# Patient Record
Sex: Female | Born: 1950 | Race: White | Hispanic: No | Smoking: Never smoker
Health system: Southern US, Community
[De-identification: ages and names within clinical notes are randomized; demographics above are authoritative.]

## PROBLEM LIST (undated history)

## (undated) DIAGNOSIS — F419 Anxiety disorder, unspecified: Secondary | ICD-10-CM

## (undated) DIAGNOSIS — C50912 Malignant neoplasm of unspecified site of left female breast: Secondary | ICD-10-CM

## (undated) DIAGNOSIS — C50919 Malignant neoplasm of unspecified site of unspecified female breast: Secondary | ICD-10-CM

## (undated) DIAGNOSIS — I10 Essential (primary) hypertension: Secondary | ICD-10-CM

## (undated) DIAGNOSIS — N289 Disorder of kidney and ureter, unspecified: Secondary | ICD-10-CM

## (undated) HISTORY — DX: Disorder of kidney and ureter, unspecified: N28.9

## (undated) HISTORY — DX: Anxiety disorder, unspecified: F41.9

## (undated) HISTORY — PX: BIOPSY BREAST: PRO8

## (undated) HISTORY — DX: Malignant neoplasm of unspecified site of unspecified female breast: C50.919

## (undated) HISTORY — PX: BREAST LUMPECTOMY: SHX2

## (undated) HISTORY — DX: Essential (primary) hypertension: I10

## (undated) HISTORY — DX: Malignant neoplasm of unspecified site of left female breast: C50.912

## (undated) HISTORY — PX: DILATION AND CURETTAGE OF UTERUS: SHX78

---

## 2006-06-03 ENCOUNTER — Ambulatory Visit: Payer: Self-pay | Admitting: Nurse Practitioner

## 2008-12-19 ENCOUNTER — Ambulatory Visit: Payer: Self-pay | Admitting: Family Medicine

## 2009-01-08 ENCOUNTER — Ambulatory Visit: Payer: Self-pay | Admitting: Family Medicine

## 2010-01-10 ENCOUNTER — Inpatient Hospital Stay: Payer: Self-pay | Admitting: *Deleted

## 2010-02-28 ENCOUNTER — Ambulatory Visit: Payer: Self-pay | Admitting: Internal Medicine

## 2011-03-06 ENCOUNTER — Ambulatory Visit: Payer: Self-pay | Admitting: Internal Medicine

## 2011-09-02 HISTORY — PX: BREAST BIOPSY: SHX20

## 2012-01-26 DIAGNOSIS — C50912 Malignant neoplasm of unspecified site of left female breast: Secondary | ICD-10-CM

## 2012-01-26 HISTORY — DX: Malignant neoplasm of unspecified site of left female breast: C50.912

## 2012-03-09 ENCOUNTER — Ambulatory Visit: Payer: Self-pay | Admitting: Internal Medicine

## 2012-03-11 ENCOUNTER — Ambulatory Visit: Payer: Self-pay | Admitting: Internal Medicine

## 2012-03-30 ENCOUNTER — Ambulatory Visit: Payer: Self-pay | Admitting: Surgery

## 2012-04-05 ENCOUNTER — Ambulatory Visit: Payer: Self-pay | Admitting: Surgery

## 2012-04-05 LAB — CBC WITH DIFFERENTIAL/PLATELET
Basophil #: 0 10*3/uL (ref 0.0–0.1)
Eosinophil #: 0.1 10*3/uL (ref 0.0–0.7)
HCT: 41.6 % (ref 35.0–47.0)
Lymphocyte %: 33.5 %
MCHC: 33.9 g/dL (ref 32.0–36.0)
Monocyte %: 7.4 %
Neutrophil #: 3.1 10*3/uL (ref 1.4–6.5)
Neutrophil %: 56.9 %
Platelet: 227 10*3/uL (ref 150–440)
RDW: 12.3 % (ref 11.5–14.5)
WBC: 5.5 10*3/uL (ref 3.6–11.0)

## 2012-04-05 LAB — BASIC METABOLIC PANEL
Anion Gap: 7 (ref 7–16)
BUN: 13 mg/dL (ref 7–18)
Chloride: 101 mmol/L (ref 98–107)
EGFR (African American): >60
Glucose: 130 mg/dL — ABNORMAL HIGH (ref 65–99)
Potassium: 3.6 mmol/L (ref 3.5–5.1)
Sodium: 141 mmol/L (ref 136–145)

## 2012-04-07 ENCOUNTER — Ambulatory Visit: Payer: Self-pay | Admitting: Radiation Oncology

## 2012-04-08 ENCOUNTER — Ambulatory Visit: Payer: Self-pay | Admitting: Surgery

## 2012-04-21 LAB — PATHOLOGY REPORT

## 2012-04-29 ENCOUNTER — Emergency Department: Payer: Self-pay | Admitting: Unknown Physician Specialty

## 2012-04-29 LAB — COMPREHENSIVE METABOLIC PANEL
Albumin: 4.1 g/dL (ref 3.4–5.0)
BUN: 8 mg/dL (ref 7–18)
Chloride: 102 mmol/L (ref 98–107)
Glucose: 130 mg/dL — ABNORMAL HIGH (ref 65–99)
Potassium: 3.9 mmol/L (ref 3.5–5.1)
SGOT(AST): 19 U/L (ref 15–37)
Sodium: 139 mmol/L (ref 136–145)

## 2012-04-29 LAB — URINALYSIS, COMPLETE
Bacteria: NONE SEEN
Glucose,UR: NEGATIVE mg/dL (ref 0–75)
Nitrite: NEGATIVE
Ph: 7 (ref 4.5–8.0)
Protein: NEGATIVE
RBC,UR: 1 /HPF (ref 0–5)
Specific Gravity: 1.011 (ref 1.003–1.030)
WBC UR: <1 /HPF (ref 0–5)

## 2012-04-29 LAB — CBC
HCT: 41.3 % (ref 35.0–47.0)
HGB: 14.2 g/dL (ref 12.0–16.0)
MCHC: 34.3 g/dL (ref 32.0–36.0)
MCV: 89 fL (ref 80–100)
Platelet: 221 10*3/uL (ref 150–440)
RDW: 12.4 % (ref 11.5–14.5)
WBC: 11.2 10*3/uL — ABNORMAL HIGH (ref 3.6–11.0)

## 2012-04-29 LAB — TROPONIN I: Troponin-I: <0.02 ng/mL

## 2012-05-02 ENCOUNTER — Ambulatory Visit: Payer: Self-pay | Admitting: Radiation Oncology

## 2012-05-05 LAB — CULTURE, BLOOD (SINGLE)

## 2012-05-09 ENCOUNTER — Emergency Department: Payer: Self-pay | Admitting: Surgery

## 2012-05-12 ENCOUNTER — Ambulatory Visit: Payer: Self-pay | Admitting: Surgery

## 2012-06-01 ENCOUNTER — Ambulatory Visit: Payer: Self-pay | Admitting: Radiation Oncology

## 2012-07-02 ENCOUNTER — Ambulatory Visit: Payer: Self-pay | Admitting: Radiation Oncology

## 2012-09-01 ENCOUNTER — Ambulatory Visit: Payer: Self-pay | Admitting: Oncology

## 2012-09-14 LAB — COMPREHENSIVE METABOLIC PANEL
Albumin: 4.2 g/dL (ref 3.4–5.0)
Alkaline Phosphatase: 104 U/L (ref 50–136)
Anion Gap: 8 (ref 7–16)
BUN: 12 mg/dL (ref 7–18)
Bilirubin,Total: 0.5 mg/dL (ref 0.2–1.0)
Chloride: 100 mmol/L (ref 98–107)
EGFR (African American): >60
Glucose: 103 mg/dL — ABNORMAL HIGH (ref 65–99)
Potassium: 4 mmol/L (ref 3.5–5.1)
SGOT(AST): 18 U/L (ref 15–37)
SGPT (ALT): 34 U/L (ref 12–78)
Sodium: 140 mmol/L (ref 136–145)
Total Protein: 7.9 g/dL (ref 6.4–8.2)

## 2012-09-14 LAB — CBC CANCER CENTER
Basophil %: 1 %
Eosinophil #: 0.3 x10 3/mm (ref 0.0–0.7)
HCT: 44.8 % (ref 35.0–47.0)
MCH: 29.7 pg (ref 26.0–34.0)
MCV: 87 fL (ref 80–100)
Monocyte #: 0.7 x10 3/mm (ref 0.2–0.9)
Neutrophil %: 46 %
WBC: 6.4 x10 3/mm (ref 3.6–11.0)

## 2012-10-02 ENCOUNTER — Ambulatory Visit: Payer: Self-pay | Admitting: Oncology

## 2012-11-10 ENCOUNTER — Ambulatory Visit: Payer: Self-pay | Admitting: Oncology

## 2013-02-14 ENCOUNTER — Ambulatory Visit: Payer: Self-pay | Admitting: Oncology

## 2013-03-01 ENCOUNTER — Ambulatory Visit: Payer: Self-pay | Admitting: Oncology

## 2013-04-01 ENCOUNTER — Ambulatory Visit: Payer: Self-pay | Admitting: Oncology

## 2013-06-22 ENCOUNTER — Ambulatory Visit: Payer: Self-pay | Admitting: Oncology

## 2013-06-22 LAB — CBC CANCER CENTER
Basophil #: 0 x10 3/mm (ref 0.0–0.1)
Eosinophil #: 0.2 x10 3/mm (ref 0.0–0.7)
Eosinophil %: 3 %
HCT: 43.7 % (ref 35.0–47.0)
Lymphocyte #: 1.8 x10 3/mm (ref 1.0–3.6)
Lymphocyte %: 31.3 %
MCH: 30.1 pg (ref 26.0–34.0)
MCHC: 34 g/dL (ref 32.0–36.0)
MCV: 89 fL (ref 80–100)
Monocyte #: 0.5 x10 3/mm (ref 0.2–0.9)
Neutrophil #: 3.2 x10 3/mm (ref 1.4–6.5)
Neutrophil %: 56.2 %
Platelet: 272 x10 3/mm (ref 150–440)
RDW: 12.2 % (ref 11.5–14.5)
WBC: 5.6 x10 3/mm (ref 3.6–11.0)

## 2013-06-22 LAB — COMPREHENSIVE METABOLIC PANEL
Albumin: 4.2 g/dL (ref 3.4–5.0)
BUN: 16 mg/dL (ref 7–18)
Bilirubin,Total: 0.6 mg/dL (ref 0.2–1.0)
Calcium, Total: 9 mg/dL (ref 8.5–10.1)
Chloride: 99 mmol/L (ref 98–107)
Co2: 34 mmol/L — ABNORMAL HIGH (ref 21–32)
Glucose: 120 mg/dL — ABNORMAL HIGH (ref 65–99)
Osmolality: 280 (ref 275–301)
SGOT(AST): 12 U/L — ABNORMAL LOW (ref 15–37)
SGPT (ALT): 21 U/L (ref 12–78)
Sodium: 139 mmol/L (ref 136–145)
Total Protein: 8 g/dL (ref 6.4–8.2)

## 2013-06-23 LAB — CANCER ANTIGEN 27.29: CA 27.29: 29.7 U/mL (ref 0.0–38.6)

## 2013-07-02 ENCOUNTER — Ambulatory Visit: Payer: Self-pay | Admitting: Oncology

## 2013-08-01 ENCOUNTER — Ambulatory Visit: Payer: Self-pay | Admitting: Oncology

## 2013-12-21 ENCOUNTER — Ambulatory Visit: Payer: Self-pay | Admitting: Oncology

## 2013-12-21 LAB — COMPREHENSIVE METABOLIC PANEL
ALK PHOS: 91 U/L
AST: 12 U/L — AB (ref 15–37)
Albumin: 4 g/dL (ref 3.4–5.0)
Anion Gap: 6 — ABNORMAL LOW (ref 7–16)
BILIRUBIN TOTAL: 0.4 mg/dL (ref 0.2–1.0)
BUN: 19 mg/dL — ABNORMAL HIGH (ref 7–18)
CALCIUM: 9.7 mg/dL (ref 8.5–10.1)
Chloride: 100 mmol/L (ref 98–107)
Co2: 34 mmol/L — ABNORMAL HIGH (ref 21–32)
Creatinine: 1.21 mg/dL (ref 0.60–1.30)
EGFR (African American): 55 — ABNORMAL LOW
EGFR (Non-African Amer.): 48 — ABNORMAL LOW
GLUCOSE: 102 mg/dL — AB (ref 65–99)
Osmolality: 282 (ref 275–301)
Potassium: 3.7 mmol/L (ref 3.5–5.1)
SGPT (ALT): 18 U/L (ref 12–78)
SODIUM: 140 mmol/L (ref 136–145)
Total Protein: 7.5 g/dL (ref 6.4–8.2)

## 2013-12-21 LAB — CBC CANCER CENTER
BASOS PCT: 1 %
Basophil #: 0.1 x10 3/mm (ref 0.0–0.1)
Eosinophil #: 0.2 x10 3/mm (ref 0.0–0.7)
Eosinophil %: 3.8 %
HCT: 42.6 % (ref 35.0–47.0)
HGB: 14.1 g/dL (ref 12.0–16.0)
Lymphocyte #: 1.8 x10 3/mm (ref 1.0–3.6)
Lymphocyte %: 30.7 %
MCH: 29.2 pg (ref 26.0–34.0)
MCHC: 33.1 g/dL (ref 32.0–36.0)
MCV: 88 fL (ref 80–100)
Monocyte #: 0.6 x10 3/mm (ref 0.2–0.9)
Monocyte %: 10.2 %
Neutrophil #: 3.2 x10 3/mm (ref 1.4–6.5)
Neutrophil %: 54.3 %
PLATELETS: 243 x10 3/mm (ref 150–440)
RBC: 4.83 10*6/uL (ref 3.80–5.20)
RDW: 12.5 % (ref 11.5–14.5)
WBC: 5.9 x10 3/mm (ref 3.6–11.0)

## 2013-12-30 ENCOUNTER — Ambulatory Visit: Payer: Self-pay | Admitting: Oncology

## 2014-01-05 ENCOUNTER — Emergency Department: Payer: Self-pay | Admitting: Emergency Medicine

## 2014-01-06 LAB — BASIC METABOLIC PANEL
Anion Gap: 5 — ABNORMAL LOW (ref 7–16)
BUN: 17 mg/dL (ref 7–18)
Calcium, Total: 9.5 mg/dL (ref 8.5–10.1)
Chloride: 100 mmol/L (ref 98–107)
Co2: 29 mmol/L (ref 21–32)
Creatinine: 1.32 mg/dL — ABNORMAL HIGH (ref 0.60–1.30)
EGFR (African American): 50 — ABNORMAL LOW
EGFR (Non-African Amer.): 43 — ABNORMAL LOW
Glucose: 122 mg/dL — ABNORMAL HIGH (ref 65–99)
Osmolality: 271 (ref 275–301)
POTASSIUM: 4.1 mmol/L (ref 3.5–5.1)
Sodium: 134 mmol/L — ABNORMAL LOW (ref 136–145)

## 2014-01-06 LAB — CBC WITH DIFFERENTIAL/PLATELET
BASOS ABS: 0.1 10*3/uL (ref 0.0–0.1)
BASOS PCT: 0.6 %
EOS ABS: 0.1 10*3/uL (ref 0.0–0.7)
EOS PCT: 0.5 %
HCT: 41.4 % (ref 35.0–47.0)
HGB: 13.8 g/dL (ref 12.0–16.0)
LYMPHS ABS: 1.2 10*3/uL (ref 1.0–3.6)
Lymphocyte %: 9.6 %
MCH: 29.7 pg (ref 26.0–34.0)
MCHC: 33.4 g/dL (ref 32.0–36.0)
MCV: 89 fL (ref 80–100)
MONO ABS: 0.8 x10 3/mm (ref 0.2–0.9)
Monocyte %: 6.1 %
NEUTROS ABS: 10.5 10*3/uL — AB (ref 1.4–6.5)
Neutrophil %: 83.2 %
Platelet: 250 10*3/uL (ref 150–440)
RBC: 4.65 10*6/uL (ref 3.80–5.20)
RDW: 12.1 % (ref 11.5–14.5)
WBC: 12.7 10*3/uL — ABNORMAL HIGH (ref 3.6–11.0)

## 2014-01-06 LAB — TROPONIN I: Troponin-I: <0.02 ng/mL

## 2014-01-07 ENCOUNTER — Observation Stay: Payer: Self-pay | Admitting: Internal Medicine

## 2014-01-07 LAB — URINALYSIS, COMPLETE
Bacteria: NONE SEEN
Bilirubin,UR: NEGATIVE
Glucose,UR: NEGATIVE mg/dL (ref 0–75)
Leukocyte Esterase: NEGATIVE
Nitrite: NEGATIVE
PH: 5 (ref 4.5–8.0)
Protein: NEGATIVE
RBC,UR: <1 /HPF (ref 0–5)
Specific Gravity: 1.008 (ref 1.003–1.030)

## 2014-01-07 LAB — APTT: Activated PTT: 27.7 secs (ref 23.6–35.9)

## 2014-01-07 LAB — PROTIME-INR
INR: 1
PROTHROMBIN TIME: 13.5 s (ref 11.5–14.7)

## 2014-01-07 LAB — TROPONIN I: Troponin-I: <0.02 ng/mL

## 2014-01-07 LAB — CK TOTAL AND CKMB (NOT AT ARMC)
CK, Total: 74 U/L
CK-MB: <0.5 ng/mL — ABNORMAL LOW (ref 0.5–3.6)

## 2014-01-26 DIAGNOSIS — G4700 Insomnia, unspecified: Secondary | ICD-10-CM | POA: Insufficient documentation

## 2014-01-27 DIAGNOSIS — R7303 Prediabetes: Secondary | ICD-10-CM | POA: Insufficient documentation

## 2014-04-03 IMAGING — MG MM DIGITAL SCREENING BILAT W/ CAD
1 series · 8 of 8 positions shown · non-contrast
Comparison: none

REASON FOR EXAM: SCR MAMMO NO ORDER
COMMENTS:

PROCEDURE:     MMM - MMM DGT SCR NO ORDER W/CAD  - March 09, 2012 [DATE]
RESULT:
Comparisons: 03/06/2011, 02/28/2010, 12/19/2008, and 12/19/2008.

[Series 7740: R CC · right · 8 of 9 slices shown]
[im 1/9]
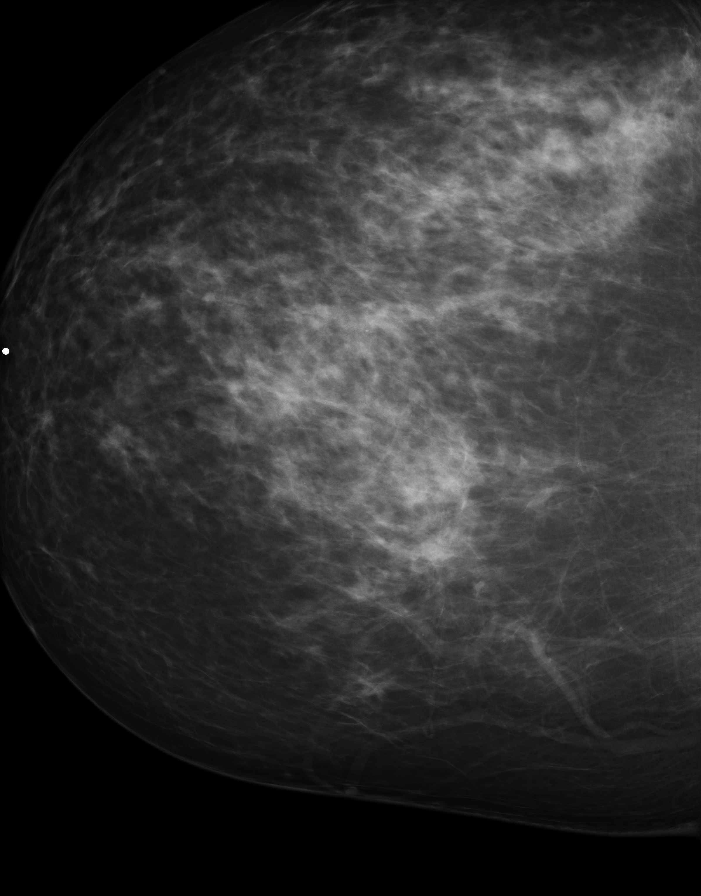
[im 2/9]
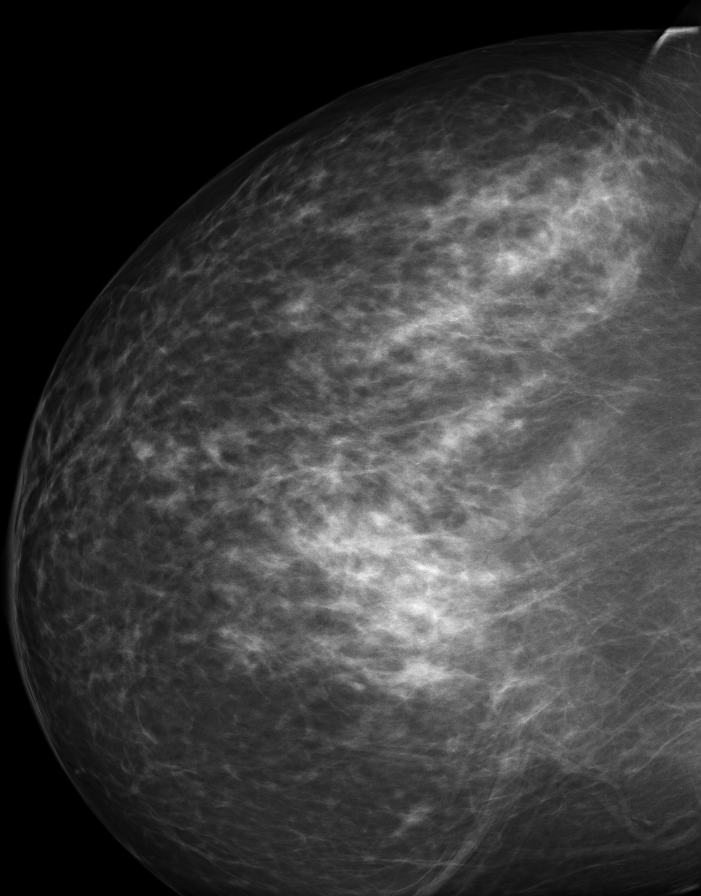
[im 3/9]
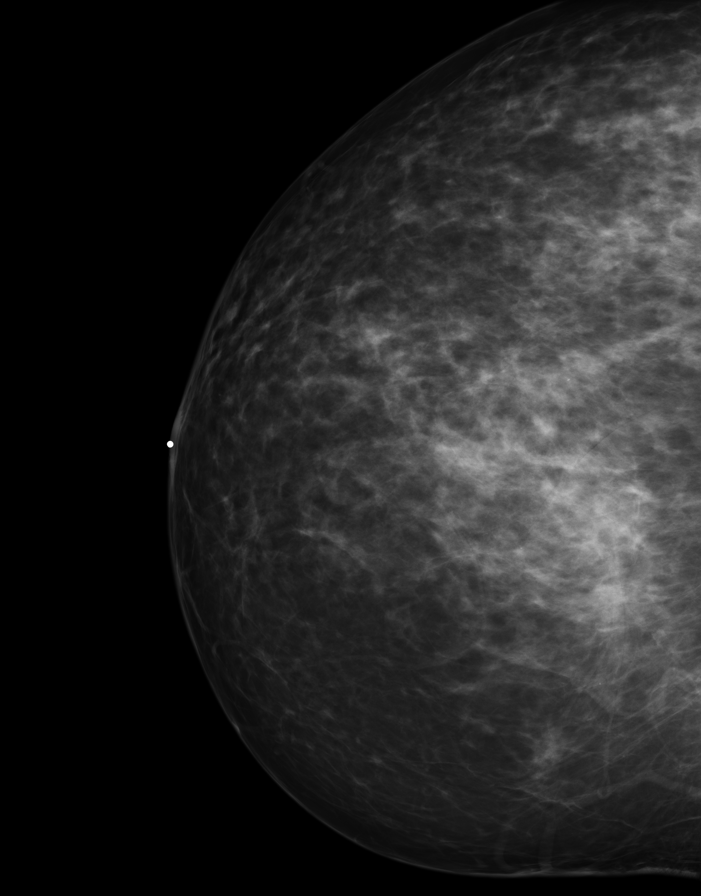
[im 4/9]
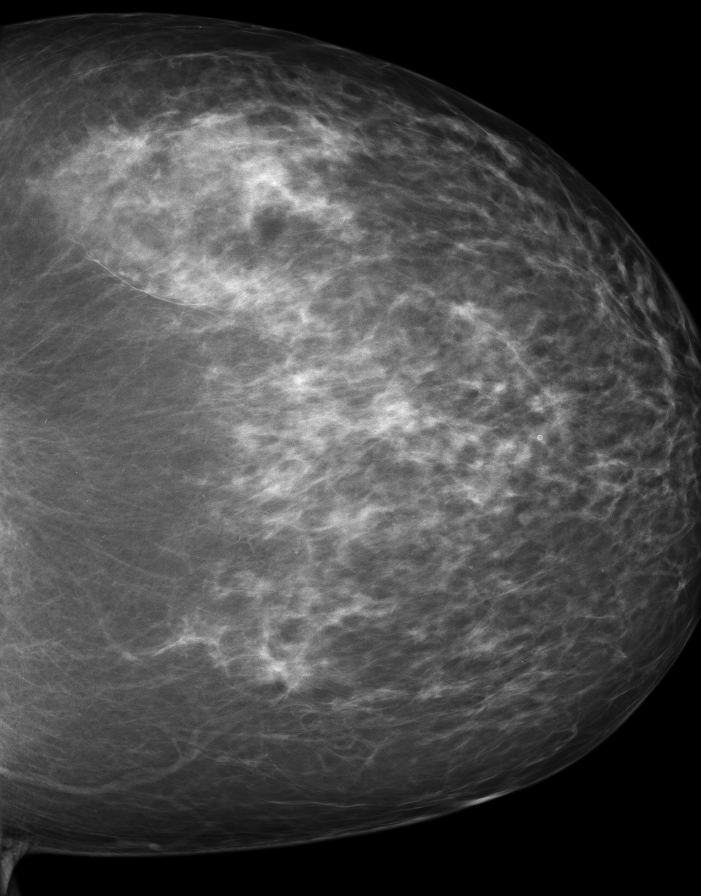
[im 5/9]
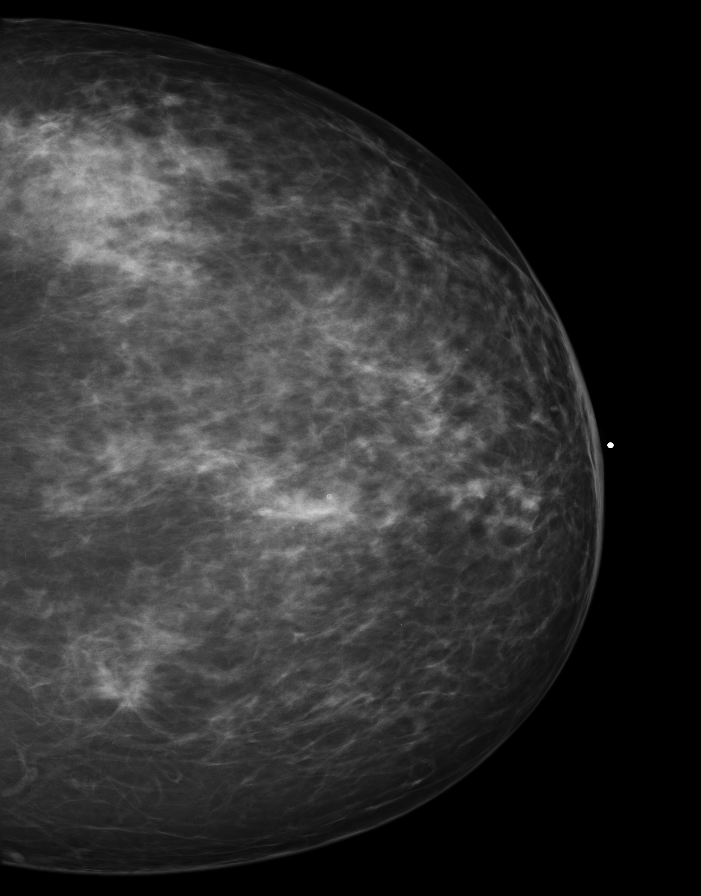
[im 6/9]
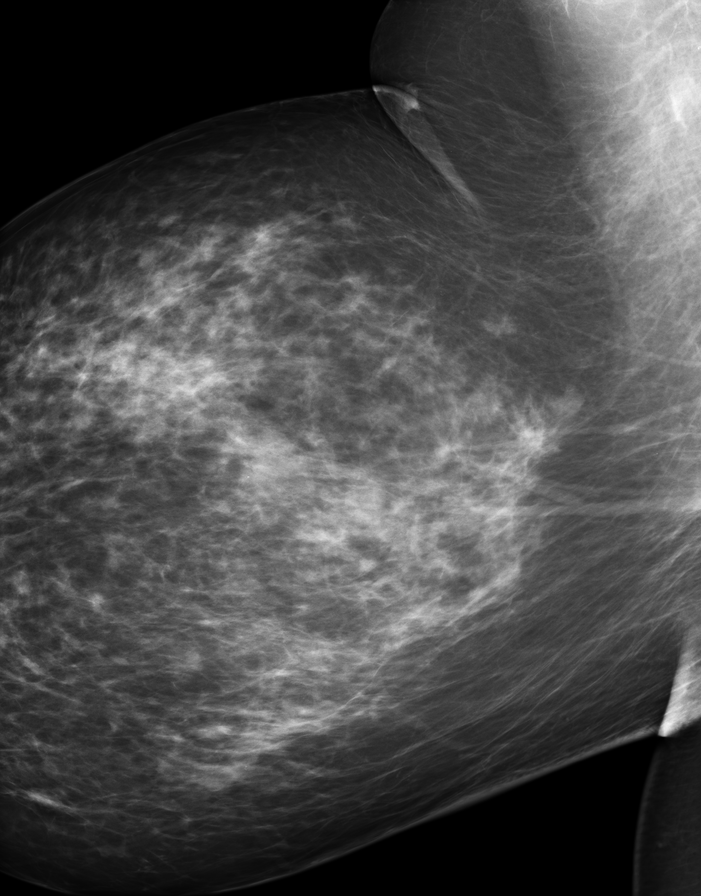
[im 7/9]
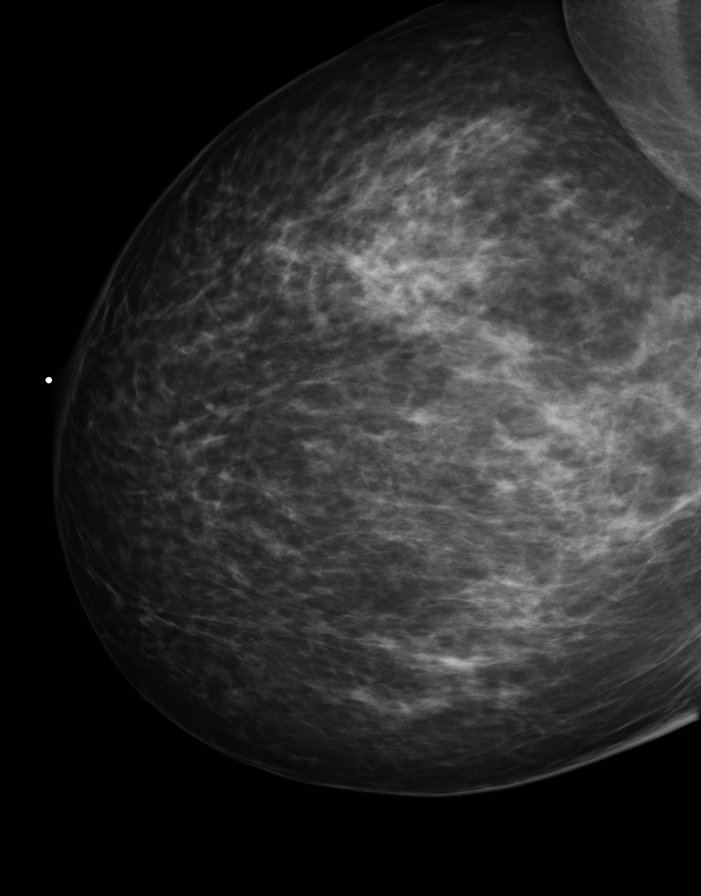
[im 9/9]
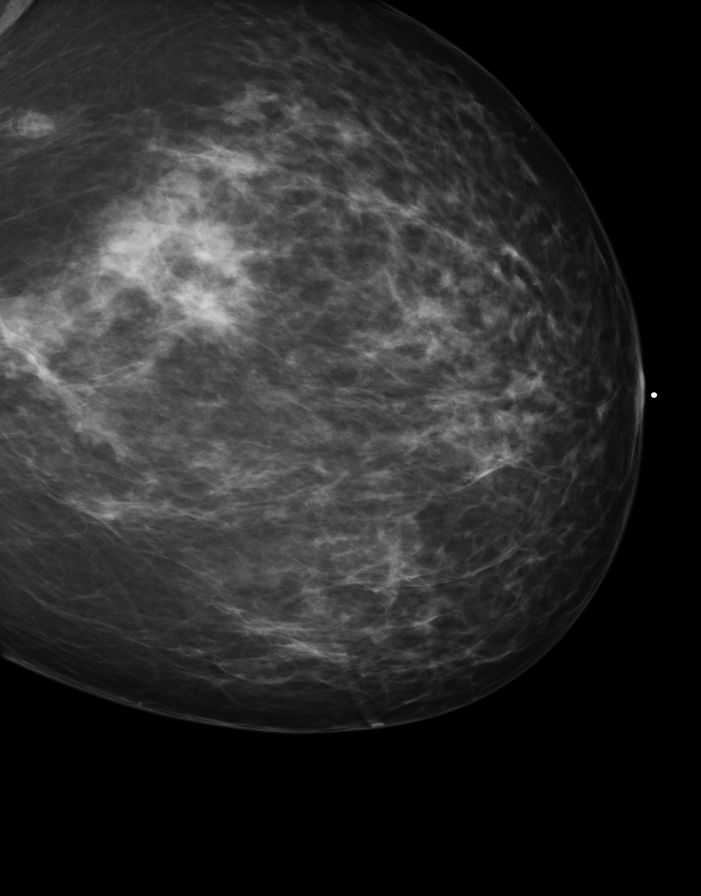

[8 of 8 positions shown; findings below may reference images not displayed]

FINDINGS: The breast tissue is heterogeneously dense. There is an asymmetry in the
left MLO view posterior depth at the level of the nipple line. An asymmetry
is seen in both of the left MLO views in a similar position. It is unclear
if these represent the same asymmetry. Additionally, there is a small
asymmetry in the medial aspect of the left CC view, posterior depth. No
suspicious masses or calcifications are identified in the right breast.
IMPRESSION: BI-RADS: Category 0 - Needs Addtional Imaging Evaluation

Recommend true lateral and spot compression magnification views of the
asymmetries seen in each of the left MLO views as well as the medial left CC
view.

A NEGATIVE MAMMOGRAM REPORT DOES NOT PRECLUDE BIOPSY OR OTHER EVALUATION OF
A CLINICALLY PALPABLE OR OTHERWISE SUSPICIOUS MASS OR LESION. BREAST CANCER
MAY NOT BE DETECTED BY MAMMOGRAPHY IN UP TO 10% OF CASES.

## 2014-04-30 IMAGING — CR DG CHEST 2V
1 series · 2 of 2 positions shown · non-contrast
Comparison: none

REASON FOR EXAM: breast ca
COMMENTS:

PROCEDURE:     DXR - DXR CHEST PA (OR AP) AND LATERAL  - April 05, 2012 [DATE]
RESULT:     The lungs are clear. The heart and pulmonary vessels are normal.
The bony and mediastinal structures are unremarkable. There is no effusion.
There is no pneumothorax or evidence of congestive failure.

[Series 1: w chest pa · 0.14mm/px · 2 of 2 slices shown]
[im 1/2]
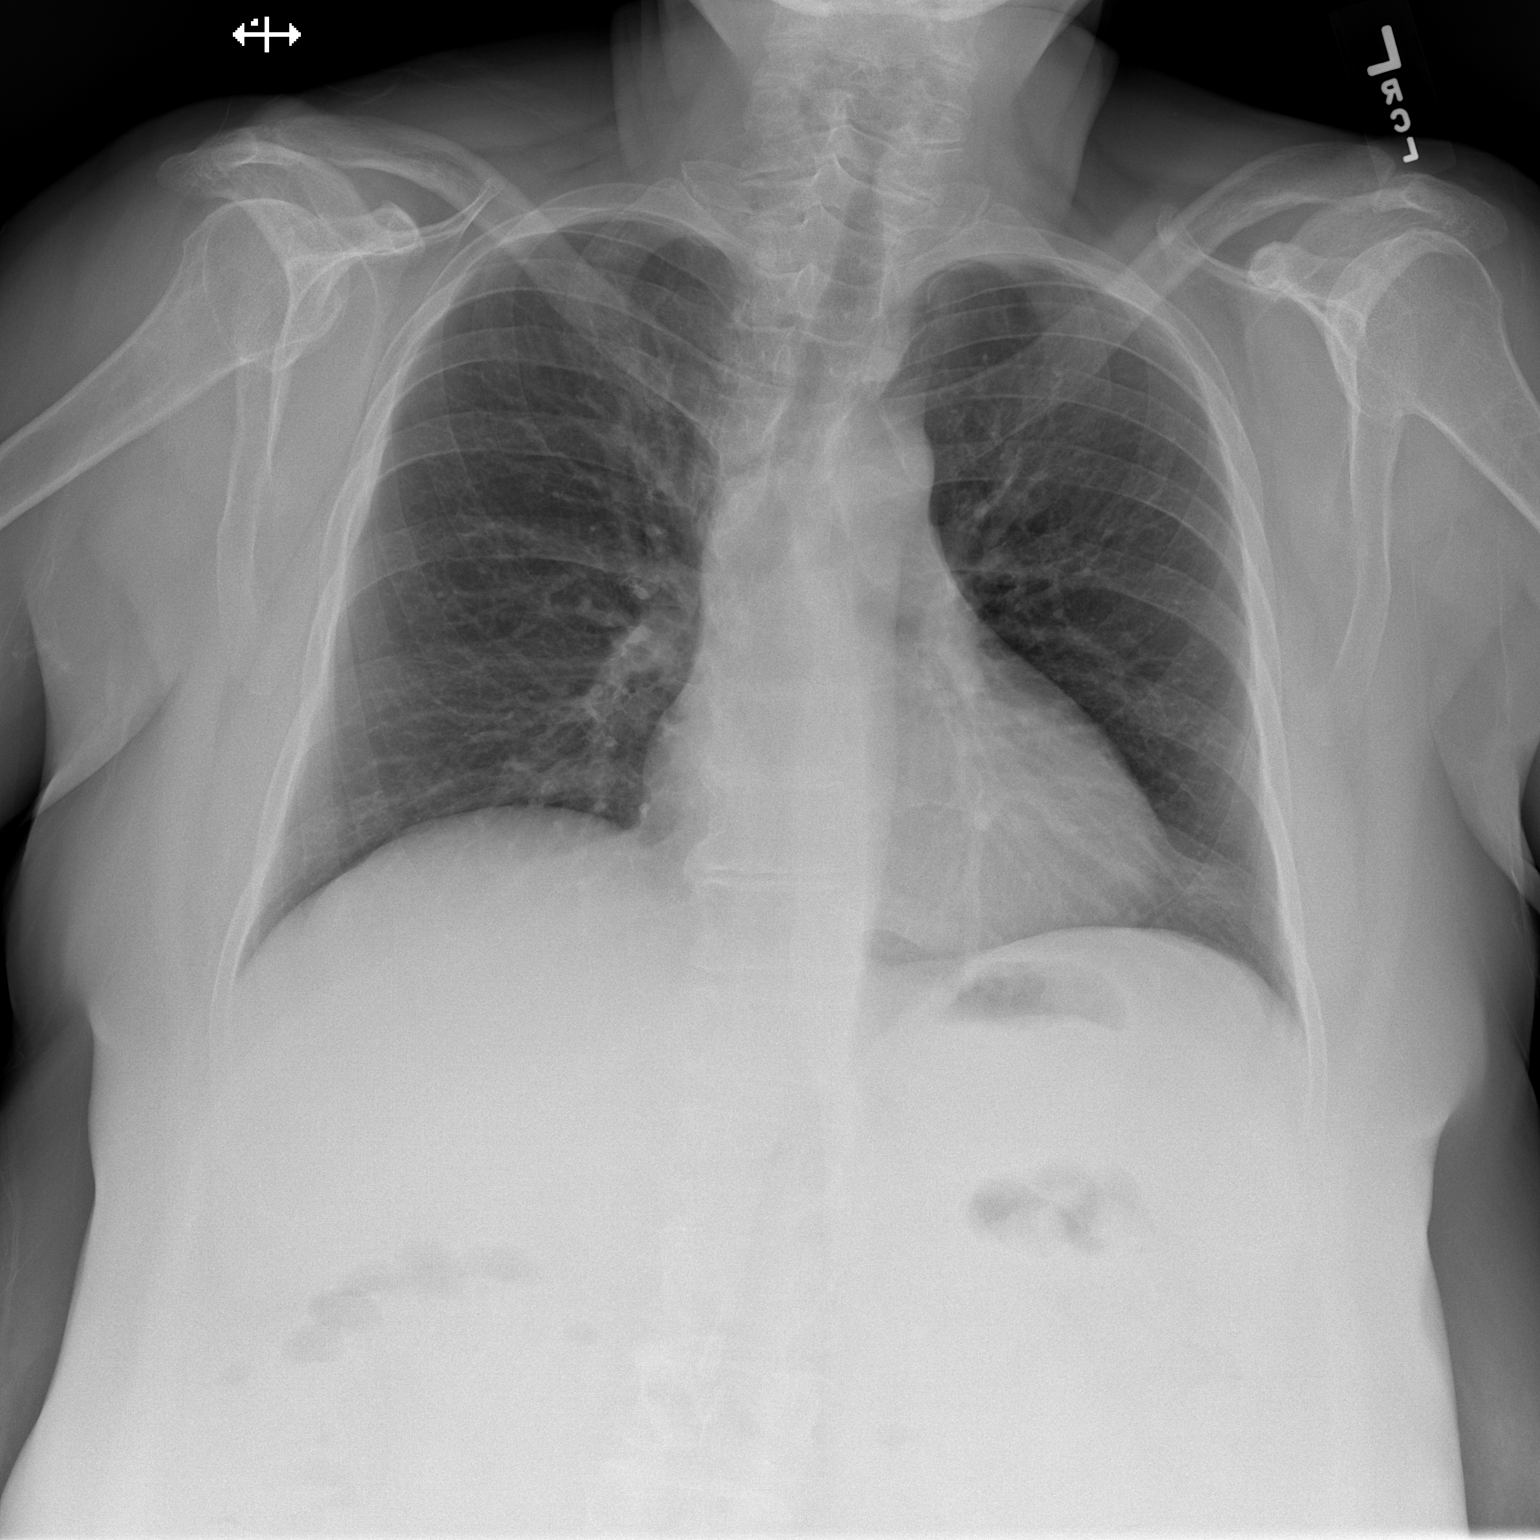
[im 2/2]
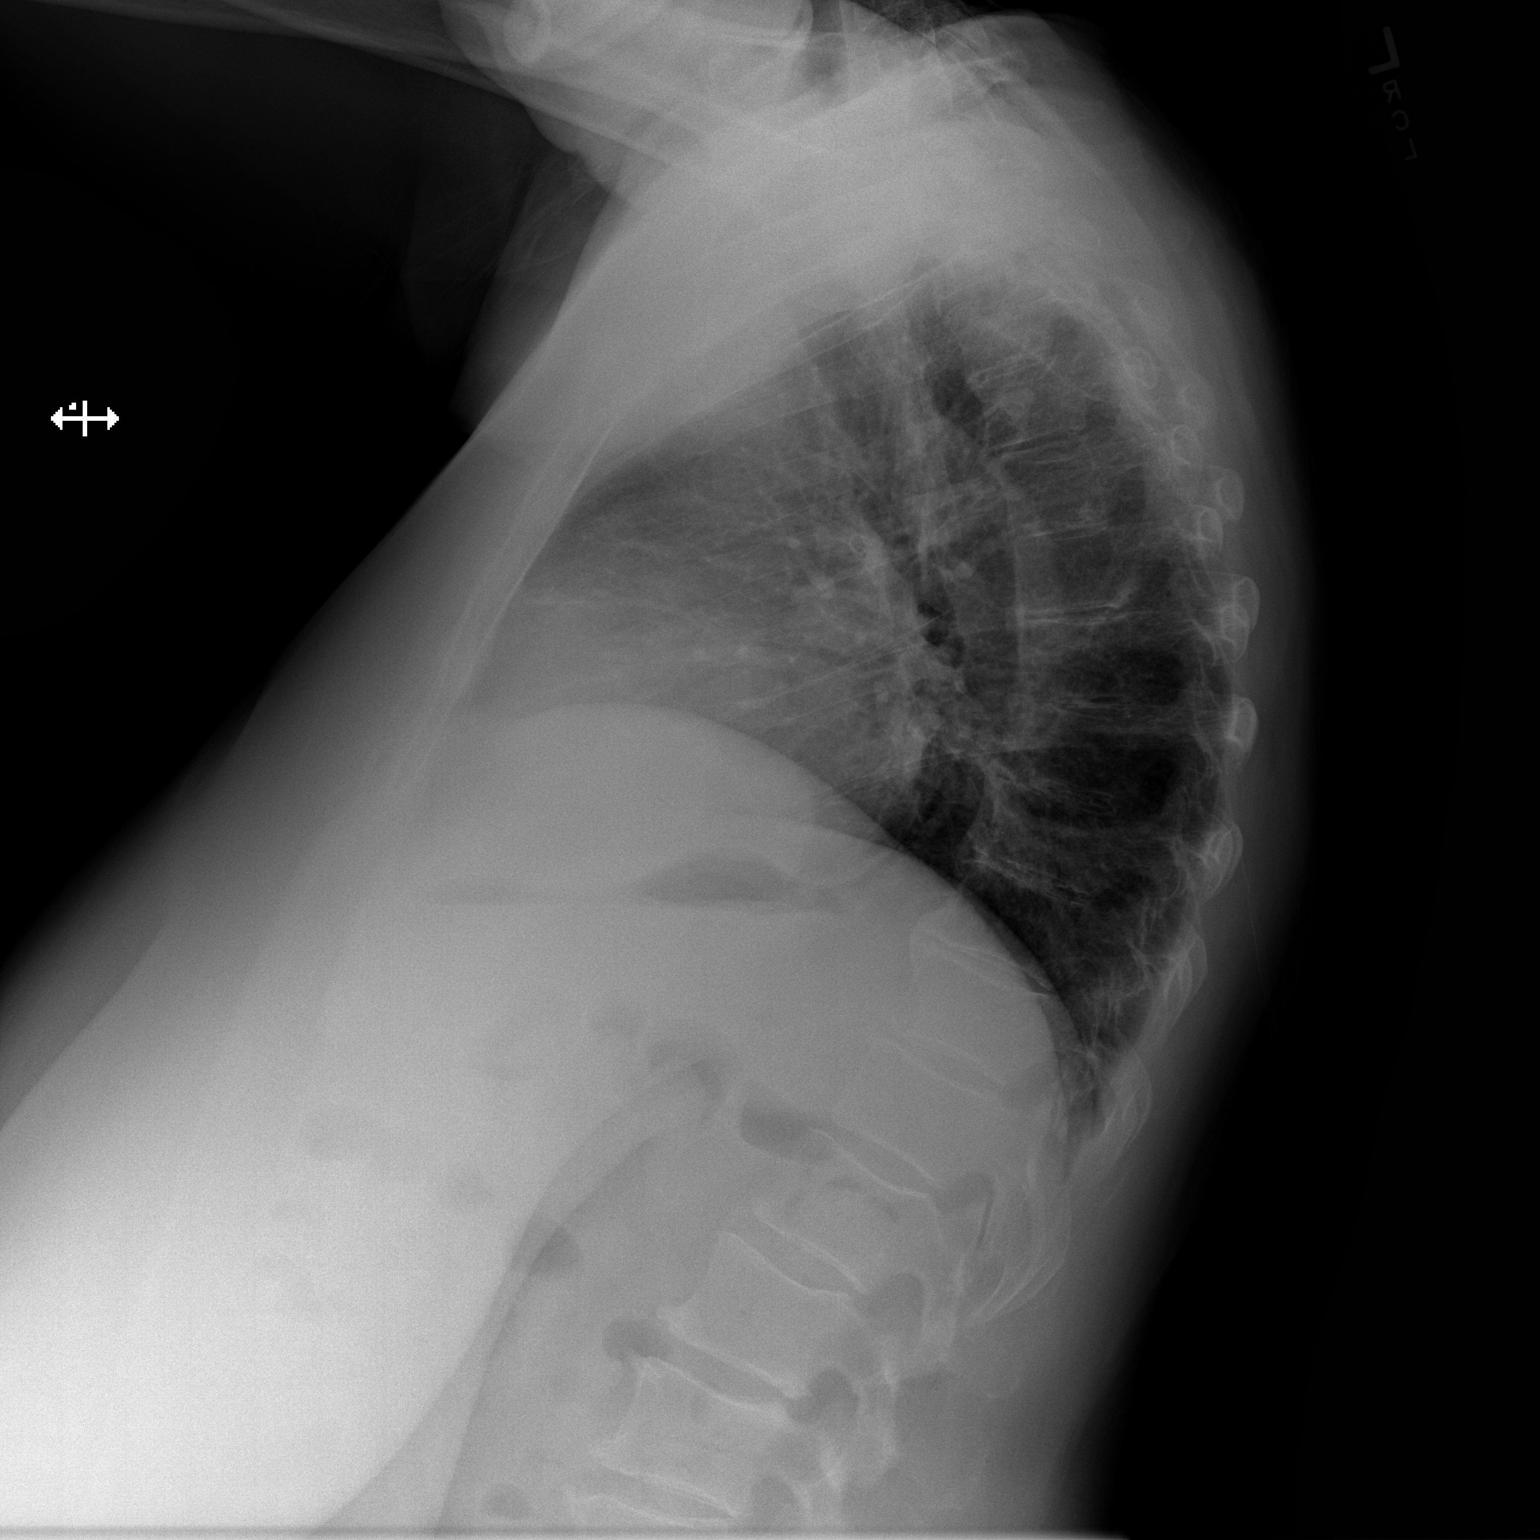

[2 of 2 positions shown; findings below may reference images not displayed]

IMPRESSION: No acute cardiopulmonary disease.

[REDACTED]

## 2014-07-08 ENCOUNTER — Ambulatory Visit: Payer: Self-pay | Admitting: Internal Medicine

## 2014-07-13 ENCOUNTER — Ambulatory Visit: Payer: Self-pay | Admitting: Oncology

## 2014-07-19 ENCOUNTER — Ambulatory Visit: Payer: Self-pay | Admitting: Oncology

## 2014-07-19 LAB — CBC CANCER CENTER
BASOS ABS: 0.1 x10 3/mm (ref 0.0–0.1)
Basophil %: 0.9 %
EOS ABS: 0.4 x10 3/mm (ref 0.0–0.7)
Eosinophil %: 4.6 %
HCT: 44.5 % (ref 35.0–47.0)
HGB: 14.7 g/dL (ref 12.0–16.0)
LYMPHS ABS: 2.8 x10 3/mm (ref 1.0–3.6)
Lymphocyte %: 34.6 %
MCH: 29.1 pg (ref 26.0–34.0)
MCHC: 33 g/dL (ref 32.0–36.0)
MCV: 88 fL (ref 80–100)
Monocyte #: 0.7 x10 3/mm (ref 0.2–0.9)
Monocyte %: 8.3 %
Neutrophil #: 4.2 x10 3/mm (ref 1.4–6.5)
Neutrophil %: 51.6 %
Platelet: 310 x10 3/mm (ref 150–440)
RBC: 5.03 10*6/uL (ref 3.80–5.20)
RDW: 12.3 % (ref 11.5–14.5)
WBC: 8.2 x10 3/mm (ref 3.6–11.0)

## 2014-07-19 LAB — COMPREHENSIVE METABOLIC PANEL
Albumin: 4.5 g/dL (ref 3.4–5.0)
Alkaline Phosphatase: 125 U/L — ABNORMAL HIGH
Anion Gap: 7 (ref 7–16)
BUN: 16 mg/dL (ref 7–18)
Bilirubin,Total: 0.4 mg/dL (ref 0.2–1.0)
Calcium, Total: 9.9 mg/dL (ref 8.5–10.1)
Chloride: 98 mmol/L (ref 98–107)
Co2: 35 mmol/L — ABNORMAL HIGH (ref 21–32)
Creatinine: 1.28 mg/dL (ref 0.60–1.30)
EGFR (Non-African Amer.): 45 — ABNORMAL LOW
GFR CALC AF AMER: 54 — AB
Glucose: 108 mg/dL — ABNORMAL HIGH (ref 65–99)
Osmolality: 281 (ref 275–301)
Potassium: 3.4 mmol/L — ABNORMAL LOW (ref 3.5–5.1)
SGOT(AST): 14 U/L — ABNORMAL LOW (ref 15–37)
SGPT (ALT): 18 U/L
SODIUM: 140 mmol/L (ref 136–145)
Total Protein: 8 g/dL (ref 6.4–8.2)

## 2014-07-20 LAB — CANCER ANTIGEN 27.29: CA 27.29: 17.3 U/mL (ref 0.0–38.6)

## 2014-08-01 ENCOUNTER — Ambulatory Visit: Payer: Self-pay | Admitting: Oncology

## 2014-08-01 DIAGNOSIS — N183 Chronic kidney disease, stage 3 unspecified: Secondary | ICD-10-CM | POA: Insufficient documentation

## 2014-12-15 ENCOUNTER — Other Ambulatory Visit: Payer: Self-pay | Admitting: Oncology

## 2014-12-15 DIAGNOSIS — M8580 Other specified disorders of bone density and structure, unspecified site: Secondary | ICD-10-CM

## 2014-12-15 DIAGNOSIS — Z853 Personal history of malignant neoplasm of breast: Secondary | ICD-10-CM

## 2014-12-19 NOTE — H&P (Signed)
PATIENT NAME:  Hayley Newman, Hayley Newman MR#:  [id number] DATE OF BIRTH:  [birth year]  DATE OF ADMISSION:  04/08/2012  CHIEF COMPLAINT: Breast cancer.   HISTORY OF PRESENT ILLNESS: This is a patient with biopsy proven breast cancer on the left side. This has been proven by core biopsy. She is here for elective left partial mastectomy with needle localization and left sentinel node biopsy on an elective basis.   PAST MEDICAL HISTORY: Newly diagnosed breast cancer.   PAST SURGICAL HISTORY: None.   ALLERGIES: None.   MEDICATIONS:  1. Citalopram. 2. Klor-Con. 3. Advil as needed.  4. Multivitamin.    REVIEW OF SYSTEMS: 10 system review has been performed and is documented in the office chart.   SOCIAL HISTORY: Patient does not smoke or drink.   FAMILY HISTORY: Noncontributory.   PHYSICAL EXAMINATION:  GENERAL: Obese female patient.   VITAL SIGNS: Temperature 97.7, heart rate of 81, other vital signs are stable.   HEENT: No scleral icterus.   NECK: No palpable neck nodes.   CHEST: Clear to auscultation.   CARDIAC: Regular rate and rhythm.   ABDOMEN: Soft, nontender.   EXTREMITIES: Without edema.   NEUROLOGIC: Grossly intact.   INTEGUMENT: No jaundice or rashes.   BREASTS: Examination shows extremely large breast size with a biopsy site in the medial portion of the left breast. Pathology confirms breast cancer of the left breast seen on mammogram and ultrasound.   LABORATORY, DIAGNOSTIC AND RADIOLOGICAL DATA: Creatinine 1.07, potassium 3.6, hemoglobin and hematocrit 14 and 42, platelet count 227.   ASSESSMENT AND PLAN: This is a patient with biopsy-proven breast cancer on the left. She is here for elective needle localized left partial mastectomy with left sentinel node biopsy. She has seen Dr. Baruch Gouty concerning options including external beam versus MammoSite. The plan in this patient with very large breasts and a lesion in the upper inner quadrant would be to not place a CED  but to perform the excisional procedure and place a MammoSite brachii therapy balloon at a later date using ultrasound guidance. This will be discussed with her in detail. I have discussed with her the risks of bleeding, infection, positive margins necessitating additional surgery, positive sentinel node requiring additional lymph node dissection and the risks of temporary or permanent sensorimotor nerve loss and the need for radiotherapy and possibly chemotherapy. She understood and agreed to proceed with this plan.   ____________________________ Jerrol Banana. Burt Knack, MD rec:cms D: 04/07/2012 20:32:41 ET T: 04/08/2012 05:58:37 ET JOB#: [id number]  cc: Jerrol Banana. Burt Knack, MD, <Dictator> Florene Glen MD ELECTRONICALLY SIGNED 04/13/2012 0:44

## 2014-12-19 NOTE — Op Note (Signed)
PATIENT NAME:  Hayley Newman, Hayley Newman MR#:  [id number] DATE OF BIRTH:  1951-06-25  DATE OF PROCEDURE:  05/12/2012  PREOPERATIVE DIAGNOSIS: Draining left breast seroma.   POSTOPERATIVE DIAGNOSIS: Draining left breast seroma.   PROCEDURE: Wound revision and drain placement of left breast seroma.   SURGEON: Lelania Bia E. Burt Knack, MD  ANESTHESIA: General with LMA.   INDICATIONS: This is a patient with breast cancer who has dehisced her wound and is draining serous fluid. Preoperatively we discussed rationale for surgery, the options of observation, risk of bleeding, infection, recurrence, cosmetic deformity and open wound. This was all reviewed for her in the preop holding area. She understood and agreed to proceed.   FINDINGS: Large seroma cavity with serous fluid. No sign of infection. No foul odor, etc.   DESCRIPTION OF PROCEDURE: Patient was induced to general anesthesia, given IV antibiotics and VTE prophylaxis was in place. She was prepped and draped in sterile fashion. The draining seroma from the wound was opened with a blunt instrument and a cavity was entered. Suction was utilized to remove serous fluid. There was no sign of infection. No cloudy fluid and no odor. The blunt instrument was advanced through the cavity to the inferior portion of the left breast at the inframammary fold. An incision was made counter to the blunt instrument and a drain was brought in through this small incision, held in with two sutures of 3-0 nylon. It was placed into the cavity and trimmed properly.   The wound was then closed in layers first with 2-0 figure-of-eight Vicryl followed by 3-0 figure-of-eight Vicryls, then interrupted horizontal mattress sutures of 2-0 nylon were placed and simple sutures of 2-0 nylon were placed. A sterile bacitracin dressing was utilized. Patient tolerated the procedure well. There were no complications. The drain was placed to bulb suction and she was taken to the recovery room in  stable condition to be discharged in care of her family.     Follow-up in 10 days. Sponge, lap and needle counts was correct.   ____________________________ Jerrol Banana. Burt Knack, MD rec:cms D: 05/12/2012 13:53:58 ET T: 05/12/2012 14:00:42 ET JOB#: [id number]  cc: Jerrol Banana. Burt Knack, MD, <Dictator> Florene Glen MD ELECTRONICALLY SIGNED 05/13/2012 12:16

## 2014-12-19 NOTE — Consult Note (Signed)
PATIENT NAME:  Hayley Newman, Hayley Newman MR#:  [id number] DATE OF BIRTH:  November 24, 1950  DATE OF CONSULTATION:  05/09/2012  REFERRING PHYSICIAN:   CONSULTING PHYSICIAN:  Jerrol Banana. Burt Knack, MD  CHIEF COMPLAINT: Drainage from left breast wound.   HISTORY OF PRESENT ILLNESS: This is a patient with a history of breast cancer. She has had necrosis of her skin edges on the left and started draining serous fluid. I spoke to her on the telephone two or three days ago and the drainage was minimal. The drainage has increased. She has actually put an ostomy bag that she had left over from her husband over the wound and is placing approximately 100 mL in the bag at this point.   She denies fevers, chills, and has no breast pain.  For additional history, see documented chart entries previously (she's been seeing me in the office almost weekly for this problem).   PHYSICAL EXAMINATION: No erythema. Serous drainage only. No sign of purulence from the left breast wound. Skin edges have dehisced.    ASSESSMENT AND PLAN: Draining seroma from the left breast following left breast partial mastectomy. I am seeing the patient in the office on Tuesday at which time I would consider and discuss with her the potential for operative debridement, closure, and placement of a diverting drain. She was amenable to this approach and I will see her in the office on Tuesday. I discussed this patient with Dr. Thomasene Lot in the ED.   ____________________________ Jerrol Banana. Burt Knack, MD rec:drc D: 05/09/2012 14:40:11 ET T: 05/09/2012 14:51:15 ET JOB#: [id number]  cc: Jerrol Banana. Burt Knack, MD, <Dictator> Florene Glen MD ELECTRONICALLY SIGNED 05/12/2012 13:07

## 2014-12-19 NOTE — Op Note (Signed)
PATIENT NAME:  Hayley Newman, Hayley Newman MR#:  [id number] DATE OF BIRTH:  27-Jun-1951  DATE OF PROCEDURE:  04/08/2012  PREOPERATIVE DIAGNOSIS: Left breast cancer.   POSTOPERATIVE DIAGNOSIS: Left breast cancer.  PROCEDURES PERFORMED: 1. Left needle localized partial mastectomy.  2. Left sentinel node biopsy.   SURGEON: Phoebe Perch, MD  ASSISTANT: Adelene Idler, PAS  ANESTHESIA: General with LMA.  INDICATIONS: This is a patient with biopsy-proven left breast cancer. She has chosen breast conservation therapy after being counseled concerning her options of mastectomy versus breast conservation therapy. We have also discussed the absolute need for radiotherapy and she has seen radiation oncology in consultation. We have discussed external beam versus accelerated partial breast irradiation and there is at least an attempt for postoperative placement of APBI balloon. The options of observation have been reviewed. The risks of bleeding, infection, recurrence of disease, positive margins necessitating additional surgery, positive sentinel node necessitating additional surgery, the potential for chemotherapy and the absolute need for radiotherapy was discussed. The limitations of APBI have been reviewed and the use of APBI will be determined based on her margins and cavity confirmation at a later date. This was all reviewed for she and her family in the preop holding area. They understood and agreed to proceed.   FINDINGS: Left partial mastectomy labeled with a nylon suture, long lateral/short superior, and sent for examination in formalin.  No palpable mass within the specimen.   No remaining masslike effect or abnormalities of the cavity walls.   Specimen #2 was the left sentinel node from the axilla, read as negative on touch prep.   This was reported intraoperatively by pathology.   DESCRIPTION OF PROCEDURE: The patient was induced to general anesthesia, given IV antibiotics. VTE prophylaxis was  in place. She was prepped and draped in sterile fashion. Marcaine was infiltrated in the skin and subcutaneous tissues around the previously placed needle localization wire as well as in the axilla and then an incision was made and dissection down through axillary fat was performed to identify what was believed to be a sentinel node. There were very few counts if any in the axilla. In fact, there were only 250 to 350 CPM in the nipple area where injection had been performed (Lymphazurin had been injected as well) and there were essentially no counts in the axilla. There was no blue stained dye in the axilla. A node was identified which was visible and palpable measuring approximately 1.5 cm. This was elevated with electrocautery and sent off for examination.   Dr. Dicie Beam from pathology called back stating that the fatty replaced lymph node was negative for macro metastasis. Inspection of the axilla failed to identify any further enlarged lymph nodes or any blue staining or CPMs to suggest additional sentinel nodes.   With these findings, that wound was further injected with additional Marcaine and then checked for hemostasis and found to be adequate. The wound was closed with layers of 3-0 Vicryl followed by 4-0 subcuticular Monocryl.   Attention was turned to the needle localization site where an incision was made and dissection down to the end of the wire was performed. A generous specimen was elevated and labeled as above with 3-0 nylon, long lateral/short superior, and passed off for examination. Inspection of the cavity demonstrated no residual mass. There was an arterial bleeder that required clips for control of hemostasis. Hemostasis was adequate. Additional Marcaine was placed for a total of 30 mL and once this was complete the wound was closed  with deep sutures of interrupted figure-of-eight 3-0 Vicryls followed by a running 3-0 Vicryl followed by a 4-0 subcuticular Monocryl.           Steri-Strips, Mastisol, and sterile dressings were placed to both wounds and she was taken to the recovery room in stable condition to be admitted for continued care. Sponge, lap and needle counts were correct. ____________________________ Jerrol Banana. Burt Knack, MD rec:slb D: 04/08/2012 13:20:59 ET T: 04/08/2012 15:34:07 ET JOB#: [id number]  cc: Jerrol Banana. Burt Knack, MD, <Dictator> Florene Glen MD ELECTRONICALLY SIGNED 04/13/2012 0:44

## 2014-12-19 NOTE — Consult Note (Signed)
Reason for Visit: This 64 year old Female patient presents to the clinic for initial evaluation of  Breast cancer .   Referred by Dr. Phoebe Perch.  Diagnosis:   Chief Complaint/Diagnosis   64 year old pendulous female with breast cancer as yet on staged prior to surgical intervention   Imaging Report Mammogram and ultrasound reviewed    Referral Report Clinical notes reviewed    Planned Treatment Regimen Await final pathology    HPI   patient is a 64 year old female who presented with an abnormal mammogram of her left breast showing a 1.5 x 1.2 x 1.2 cm ill-defined mass highly suggestive of malignancy. She underwent ultrasound-guided biopsy which was positive for invasive mammary carcinoma. She had her core biopsyperformed and is scheduled for wide local excision and sentinel node biopsy tomorrow by Dr. Burt Knack. She is extremely large pendulous breasts which would make whole breast radiation extremely difficult. I been asked to evaluate the patient prior to surgery.  Past Hx:    Kidney Insuffiency:    Cancer Left Breast:    Anxiety:    Hypertension:    D&C X 2:    Breast biopsy: 30-Mar-2012  Past, Family and Social History:   Past Medical History positive    Cardiovascular hypertension    Genitourinary Renal insufficiency    Neurological/Psychiatric anxiety; depression    Past Surgical History D&C x2    Family History positive    Family History Comments Family history of breast cancer    Social History positive    Social History Comments Social EtOH use history    Additional Past Medical and Surgical History Seen accompanied by friend today   Allergies:   No Known Allergies:   Home Meds:  Home Medications: Medication Instructions Status  Centrum Silver 1  orally once a day (in the morning) Active  Vitamin D 1000 intl units oral tablet 2 tab(s) orally once a day (in the morning) Active  hydrochlorothiazide-triamterene 25 mg-37.5 mg oral tablet 1 tab(s)  orally once a day (in the morning) Active  atenolol 50 mg oral tablet 1 tab(s) orally once a day (in the morning) Active  citalopram 20 mg oral tablet 1 tab(s) orally once a day (in the morning) Active  zolpidem 10 mg oral tablet 1 tab(s) orally once a day (at bedtime), As Needed Active  potassium chloride 10 mEq oral tablet, extended release 2 tab(s) orally once a day (in the morning) Active   Review of Systems:   General negative    Performance Status (ECOG) 0    Skin negative    Breast see HPI    Ophthalmologic negative    ENMT negative    Respiratory and Thorax negative    Cardiovascular negative    Gastrointestinal negative    Genitourinary negative    Musculoskeletal negative    Neurological negative    Psychiatric negative    Hematology/Lymphatics negative    Endocrine negative    Allergic/Immunologic negative   Physical Exam:  General/Skin/HEENT:   General normal    Skin normal    Eyes normal    ENMT normal    Head and Neck normal    Additional PE Well-developed female appears older than stated age with teeth in poor state of repair. She is extremely large pendulous breasts. I can detect no evidence of mass or nodularity in either breast into position examined. No supraclavicular or axillary adenopathy is appreciated. Lungs are clear to A&P cardiac examination shows regular rate and rhythm.  Breasts/Resp/CV/GI/GU:   Respiratory and Thorax normal    Cardiovascular normal    Gastrointestinal normal    Genitourinary normal   MS/Neuro/Psych/Lymph:   Musculoskeletal normal    Neurological normal    Lymphatics normal   Other Results:  Radiology Results: [place]:    11-Jul-13 14:00, Digital Additional Views Lt Breast (SCR)   Digital Additional Views Lt Breast (SCR)    REASON FOR EXAM:    AV LT ASYMMETRIES  COMMENTS:       PROCEDURE: MAM - MAM DGTL ADD VW LT  SCR  - Mar 11 2012  2:00PM     RESULT:     Comparison:  03/09/2012.    True lateral and spot compression magnification views were performed to   evaluate the findings in the superomedial left breast on the screening   mammograms. The spot compression magnification views demonstrate a   spiculated mass with ill-defined borders in the superomedial left breast   at approximately 10 o'clock. There is associated architectural distortion.    Real time ultrasound was performed of the left breast from 9 o'clock   through 11 o'clock. This demonstrated an ill-defined, hypoechoic mass   with posterior acoustic shadowing. The mass is taller and wide. The   borders are ill-defined. The mass measures approximately 1.5 x 1.2 x 1.2   cm.    IMPRESSION:     BI-RADS: Category 5 - Highly Suggestive of Malignancy.      Surgical consultation and tissue diagnosis are recommended for the very   suspicious mass in the superomedial left breast.    A NEGATIVE MAMMOGRAM REPORT DOES NOT PRECLUDE BIOPSY OR OTHER EVALUATION   OF A CLINICALLY PALPABLE OR OTHERWISE SUSPICIOUS MASS OR LESION. BREAST     CANCER MAY NOT BE DETECTED BY MAMMOGRAPHY IN UP TO 10% OF CASES.      Thank you for the opportunity to contribute to the care of your patient.           Verified By: Gregor Hams, M.D., MD   Assessment and Plan:  Impression:   probable early stage breast cancer in 64 year old female with extremely large pendulous breasts.  Plan:   I will defer my recommendations he will after final pathology of her wide local excision and sentinel node biopsy are performed. Based on her large breast size external beam radiation to her whole breast will be extremely difficult and would cause excessive skin reaction. Would prefer to place MammoSite catheter balloon and treated with high-dose rate remote afterloading based on her accelerated partial breast irradiation protocol. I have tried to contact Dr. Antionette Char office to inform him of my recommendations for that procedure. Certainly  after final pathology Dr. Burt Knack can go back in an attempt to place MammoSite catheter balloon. Risks and benefits of external beam radiation to her whole breast versus high-dose rate remote afterloading to partial breast were explained in detail to the patient. Will await her final pathology report. I have set her for 2 week followup to discuss further treatment plan at that time.  I would like to take this opportunity to thank you for allowing me to continue to participate in this patient's care.  CC Referral:   cc: Dr. Phoebe Perch, Dr. Ezequiel Kayser   Electronic Signatures: Baruch Gouty Roda Shutters (MD)  (Signed 07-Aug-13 16:41)  Authored: HPI, Diagnosis, Past Hx, PFSH, Allergies, Home Meds, ROS, Physical Exam, Other Results, Encounter Assessment and Plan, CC Referring Physician   Last Updated: 07-Aug-13 16:41 by  Clova Morlock, Roda Shutters (MD)

## 2014-12-19 NOTE — H&P (Signed)
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   HISTORY OF PRESENT ILLNESS: This is a patient who is approximately one month status post left partial mastectomy and sentinel node biopsy for stage I breast cancer. She has had a chronic draining wound, was not a candidate for brachii therapy, and her wound started draining last week. It is placing out a lot of serous fluid of a chronic nature and the wound has dehisced.   She is here today for elective wound revision. The plan is for wound closure over a drain to shunt the serous fluid away from the skin edges so that it will heal properly.   PAST MEDICAL HISTORY: Breast cancer.   PAST SURGICAL HISTORY: Partial mastectomy and sentinel node biopsies.   ALLERGIES: No known drug allergies.   MEDICATIONS: Citalopram, Klor-Con, Advil, and multivitamins.   FAMILY HISTORY: Noncontributory.   SOCIAL HISTORY: The patient does not smoke or drink.   REVIEW OF SYSTEMS: Ten system review is documented in the chart.   PHYSICAL EXAMINATION:   GENERAL: Obese female patient.   HEENT: No scleral icterus.  Poor dentition.  NECK: No palpable neck nodes.   CHEST: Clear to auscultation.   CARDIAC: Regular rate and rhythm.   ABDOMEN: Soft, nontender.   EXTREMITIES: No edema. Calves are nontender.   NEUROLOGIC: Grossly intact.   INTEGUMENT: No jaundice.   BREASTS: Right breast shows no masses. Left breast shows a chronic open wound with serous drainage and no erythema.   ASSESSMENT AND PLAN: This is a patient for left breast wound revision. I discussed with her in the ED four days ago the rationale for offering this approach and the potential that it may not work and the need for drain placement and possibly additional surgery. She understood and agreed with this plan. These risks will be reviewed for her in detail in the  preop area prior to surgery as well.  ____________________________ Jerrol Banana. Burt Knack, MD rec:slb D: 05/11/2012 20:02:07 ET T: 05/12/2012 07:01:33 ET JOB#: [id number]  cc: Jerrol Banana. Burt Knack, MD, <Dictator> Florene Glen MD ELECTRONICALLY SIGNED 05/12/2012 13:07

## 2014-12-23 NOTE — Discharge Summary (Signed)
PATIENT NAME:  Hayley Newman, Hayley Newman MR#:  [id number] DATE OF BIRTH:  1950-10-23  DATE OF ADMISSION:  01/07/2014 DATE OF DISCHARGE:  01/08/2014  ADMITTING PHYSICIAN:  Dr. Lavetta Nielsen  DISCHARGING PHYSICIAN: Dr. Tressia Miners   PRIMARY CARE PHYSICIAN: At [facility].   DISCHARGE DIAGNOSES:  1. Vasovagal syncope.  2. Hypertension.  3. Left mandibular abscess.   DISCHARGE HOME MEDICATIONS:  1. Centrum Silver 1 tablet p.o. daily.  2. Vitamin D 1000 international units 2 tablets daily.  3. HCTZ/triamterene 25/37.5 mg 1 tablet p.o. daily.  4. Atenolol 50 mg daily.  5. Calcium 600 mg daily.  6. Anastrozole 1 mg daily.  7. Tramadol 50 mg q. 8 hours p.r.n. for pain.  8. Clindamycin 150 mg q. 6 hours for 10 more days.  9. Probiotic capsule daily.   DISCHARGE DIET: Low-sodium diet. Consistency is mechanical soft diet.   DISCHARGE ACTIVITY: As tolerated.    FOLLOWUP INSTRUCTIONS:  1. Dental followup in 2-3 days.  2. PCP followup in 1 week.   LABORATORY AND IMAGING STUDIES PRIOR TO DISCHARGE: Maxillofacial CT showing dental caries involving left lower 2nd bicuspid tooth with erosion into the lateral cortex of the mandible adjacent to the root of the tooth.  There is some fluid collection superficially indicating possible small abscess. Also, subcutaneous edema superficial to the left mandible noted.  Sinus inflammatory disease also seen in the left maxillary sinus.   Cardiac enzymes negative. Urinalysis negative for any infection. Chest x-ray revealing clear lung fields. No acute cardiopulmonary disease and CT head showing no acute intracranial abnormality, mild to moderate chronic small vessel ischemic disease changes, and left maxillary sinus disease noted.  CT of C-spine is negative.   WBC 12.7, hemoglobin 13.8, hematocrit 41.4, platelet count 250,000.   Sodium 134, potassium 4.1, chloride 100, bicarbonate 29, BUN 17, creatinine 1.82, glucose 122 and calcium of 9.5. CK was 74.   BRIEF  HOSPITAL COURSE: The patient is a 64 year old pleasant Caucasian female with past medical history significant for hypertension, presents to the hospital after a passing out episode.  Syncope. The patient was in the ER the day prior to the syncopal episode for a left mandibular abscess and was discharged on clindamycin and tramadol. The patient did take her antibiotic and the pain medicine, was working out on the lawn outside the home and blacked out. She thinks she might have been dehydrated and was exposed to sun and took strong medications. She was monitored on telemetry. She did not have any cardiac history. Troponins were negative. She has been symptomatic or had further syncopal episodes in the hospital. She was given IV fluids and is in a stable condition to be discharged home.  Left mandibular abscess as noted on the CT started with the dental caries of the lower mandibular tooth and she said she is supposed to call her dentist tomorrow to make an appointment and have scheduled for 4 of the teeth from the lower jaw pulled out. She was advised to call the dentist.  Her inflammation and swelling has improved, although she does have significant left mandibular swelling. She is being discharged on clindamycin and tramadol for pain as needed. Her course has been otherwise uneventful in the hospital.   DISCHARGE CONDITION: Stable.   DISCHARGE DISPOSITION: Home.   TIME SPENT ON DISCHARGE: 40 minutes.     ____________________________ Gladstone Lighter, MD rk:dd D: 01/08/2014 13:41:00 ET T: 01/09/2014 00:33:07 ET JOB#: [id number]  cc: Gladstone Lighter, MD, <Dictator> [facility]  Gladstone Lighter MD ELECTRONICALLY SIGNED 01/26/2014 11:58

## 2014-12-23 NOTE — H&P (Signed)
PATIENT NAME:  Hayley Newman, AGUADO MR#:  [id number] DATE OF BIRTH:  1951/08/07  DATE OF ADMISSION:  01/06/2014  REFERRING PHYSICIAN: Elta Guadeloupe R. Jacqualine Code, MD  PRIMARY CARE PHYSICIAN: Christena Flake. Raechel Ache, MD  CHIEF COMPLAINT: Passing out.   A 64 year old Caucasian female with history of hypertension, breast cancer, presenting after a syncopal episode. She was outside working on her lawnmower today in her usual state of health when she had an episode where she passed out. It was a witnessed event. No noted seizure activity or shaking activity. She denies any preceding symptoms including chest pain, palpitations, or shortness of breath. Denies any postictal confusion or loss of bowel or bladder function. She is now back to her usual state of health. Of note, she was recently evaluated in the Emergency Department one day prior to arrival or tooth abscess, discharged on clindamycin, which she is still taking. She has no current complaints at this time.   REVIEW OF SYSTEMS:  CONSTITUTIONAL: Denies fever, fatigue, weakness.  EYES: Denies blurry vision, double vision, or eye pain. HEENT: Denies tinnitus, ear pain, hearing loss.  RESPIRATORY: Denies cough, wheeze, shortness of breath.  CARDIOVASCULAR: Denies chest pain, palpitations, edema.  GASTROINTESTINAL: Denies nausea, vomiting, diarrhea, abdominal pain.  GENITOURINARY: Denies dysuria, hematuria.  ENDOCRINE: Denies nocturia or thyroid problems. HEMATOLOGIC/LYMPHATIC: Denies easy bruising, bleeding.  SKIN: Denies rash or lesions.  MUSCULOSKELETAL: Denies pain in the neck, back, shoulders, knees, hips or arthritic symptoms.  NEUROLOGIC: Denies paralysis, paresthesias.  PSYCHIATRIC: Denies anxiety or depressive symptoms.   PAST MEDICAL HISTORY: Hypertension, breast cancer.   SOCIAL HISTORY: Denies any alcohol, tobacco or drug usage.   FAMILY HISTORY: Denies any known cardiovascular illnesses.   ALLERGIES: LEVAQUIN AND TAPE.   HOME MEDICATIONS: Include:  Ibuprofen 800 mg p.o. 3 times daily as needed for pain, tramadol 50 mg p.o. q.8 hours as needed for pain, Tylenol extra strength 500/25 mg by mouth at bedtime as needed for inability to sleep, Tylenol with codeine, 1 to 2 tablets every 6 hours as needed for pain, citalopram 20 mg p.o. daily, hydrochlorothiazide/triamterene 25/37.5 mg p.o. daily, Arimidex 1 mg p.o. daily, atenolol 50 mg p.o. daily, Cleocin 150 mg p.o. q. 6 hours. This will be day 2 of antibiotic therapy. Potassium 10 mEq 2 tablets p.o. daily, Centrum Silver 1 tablet p.o. daily, vitamin D 1000 international units 2 tablets daily.   PHYSICAL EXAMINATION: VITAL SIGNS: Temperature 98.3, heart rate 65, respirations 20, blood pressure 134/62, saturating 94% on room air.  GENERAL: Well-nourished, well-developed, Caucasian female, currently in no acute distress.  HEAD: Normocephalic, atraumatic. Eyes: Pupils equal, round, and reactive. Extraocular muscles intact. No scleral icterus. Mouth: Moist mucous membranes. Poor dentition. Unable to fully visualize of the area of prior mentioned abscess; however, does have a large area of erythema and edema noted on the left face over the mandibular portion. Ears/Nose/Throat: Throat clear without exudates. No external lesions.  NECK: Supple. No thyromegaly. No nodules. No JVD.  PULMONARY: Clear to auscultation bilaterally without wheezes, rales, rhonchi. No use of accessory muscles. Good respiratory effort. CHEST: Nontender to palpation. CARDIOVASCULAR: S1, S2, regular rate and rhythm. No murmurs, rubs, or gallops. No JVD. Pedal pulses 2+ bilaterally.  GASTROINTESTINAL: Soft, nontender, nondistended. No masses. Positive bowel sounds. No hepatosplenomegaly.  MUSCULOSKELETAL: No swelling, clubbing, or edema. Range of motion full in all extremities.  NEUROLOGIC: Cranial nerves II through XII intact. No gross neurologic deficits. Sensation intact. Reflexes intact.  SKIN: No ulcerations, lesions, rashes or  cyanosis. Skin warm,  dry. Turgor intact.  PSYCHIATRIC: Mood and affect within normal limits. The patient alert and oriented x3. Insight and judgment intact.   LABORATORY DATA: EKG performed with normal sinus rhythm. There is, however, note of the anterior T inversions, which is unchanged from prior films. Sodium 134 potassium 4.1, chloride 100, bicarbonate 29, BUN 17, creatinine 1.32, glucose 112. WBC 12.7, hemoglobin 13.8, platelets 250,000.   ASSESSMENT AND PLAN: A 64 year old Caucasian female with history of hypertension and breast cancer, is presenting after a syncopal episode.  1. Syncope. Telemetry under observational status. Will trend cardiac enzymes and also check orthostatic vital signs, as these have not been performed yet; however, she has received some IV fluid hydration. Will continue with IV fluid hydration.  2. Acute kidney injury. IV fluid hydration; hydrochlorothiazide.  3. Known tooth abscess. Continue her clindamycin.  4. Venous thromboembolism prophylaxis on heparin subcutaneous.  5. The patient is full code.   TIME SPENT: 45 minutes.   ____________________________ Aaron Mose. Neithan Day, MD dkh:lm D: 01/07/2014 01:32:48 ET T: 01/07/2014 04:23:54 ET JOB#: [id number]  cc: Aaron Mose. Sheenah Dimitroff, MD, <Dictator> Acelyn Basham Woodfin Ganja MD ELECTRONICALLY SIGNED 01/07/2014 20:49

## 2015-01-12 ENCOUNTER — Other Ambulatory Visit: Payer: Self-pay | Admitting: *Deleted

## 2015-01-12 DIAGNOSIS — C50919 Malignant neoplasm of unspecified site of unspecified female breast: Secondary | ICD-10-CM

## 2015-01-17 ENCOUNTER — Other Ambulatory Visit: Payer: Self-pay

## 2015-01-17 ENCOUNTER — Ambulatory Visit: Payer: Self-pay | Admitting: Oncology

## 2015-01-25 ENCOUNTER — Inpatient Hospital Stay (HOSPITAL_BASED_OUTPATIENT_CLINIC_OR_DEPARTMENT_OTHER): Admitting: Oncology

## 2015-01-25 ENCOUNTER — Inpatient Hospital Stay: Attending: Oncology

## 2015-01-25 ENCOUNTER — Encounter: Payer: Self-pay | Admitting: Oncology

## 2015-01-25 VITALS — BP 126/82 | HR 78 | Temp 94.7°F | Wt 178.6 lb

## 2015-01-25 DIAGNOSIS — E669 Obesity, unspecified: Secondary | ICD-10-CM | POA: Insufficient documentation

## 2015-01-25 DIAGNOSIS — C50912 Malignant neoplasm of unspecified site of left female breast: Secondary | ICD-10-CM | POA: Insufficient documentation

## 2015-01-25 DIAGNOSIS — R600 Localized edema: Secondary | ICD-10-CM | POA: Diagnosis not present

## 2015-01-25 DIAGNOSIS — Z170 Estrogen receptor positive status [ER+]: Secondary | ICD-10-CM | POA: Insufficient documentation

## 2015-01-25 DIAGNOSIS — N289 Disorder of kidney and ureter, unspecified: Secondary | ICD-10-CM | POA: Diagnosis not present

## 2015-01-25 DIAGNOSIS — Z79899 Other long term (current) drug therapy: Secondary | ICD-10-CM | POA: Insufficient documentation

## 2015-01-25 DIAGNOSIS — I10 Essential (primary) hypertension: Secondary | ICD-10-CM | POA: Insufficient documentation

## 2015-01-25 DIAGNOSIS — Z79811 Long term (current) use of aromatase inhibitors: Secondary | ICD-10-CM

## 2015-01-25 DIAGNOSIS — F419 Anxiety disorder, unspecified: Secondary | ICD-10-CM | POA: Diagnosis not present

## 2015-01-25 DIAGNOSIS — C50919 Malignant neoplasm of unspecified site of unspecified female breast: Secondary | ICD-10-CM

## 2015-01-25 LAB — COMPREHENSIVE METABOLIC PANEL
ALK PHOS: 89 U/L (ref 38–126)
ALT: 16 U/L (ref 14–54)
ANION GAP: 6 (ref 5–15)
AST: 20 U/L (ref 15–41)
Albumin: 4.7 g/dL (ref 3.5–5.0)
BUN: 16 mg/dL (ref 6–20)
CALCIUM: 9.7 mg/dL (ref 8.9–10.3)
CO2: 33 mmol/L — ABNORMAL HIGH (ref 22–32)
Chloride: 98 mmol/L — ABNORMAL LOW (ref 101–111)
Creatinine, Ser: 1.19 mg/dL — ABNORMAL HIGH (ref 0.44–1.00)
GFR calc Af Amer: 55 mL/min — ABNORMAL LOW (ref >60)
GFR calc non Af Amer: 47 mL/min — ABNORMAL LOW (ref >60)
GLUCOSE: 114 mg/dL — AB (ref 65–99)
Potassium: 4.4 mmol/L (ref 3.5–5.1)
Sodium: 137 mmol/L (ref 135–145)
Total Bilirubin: 0.7 mg/dL (ref 0.3–1.2)
Total Protein: 7.9 g/dL (ref 6.5–8.1)

## 2015-01-25 LAB — CBC WITH DIFFERENTIAL/PLATELET
BASOS ABS: 0.1 10*3/uL (ref 0–0.1)
BASOS PCT: 1 %
Eosinophils Absolute: 0.4 10*3/uL (ref 0–0.7)
Eosinophils Relative: 5 %
HCT: 42.6 % (ref 35.0–47.0)
Hemoglobin: 14.1 g/dL (ref 12.0–16.0)
LYMPHS PCT: 24 %
Lymphs Abs: 2.2 10*3/uL (ref 1.0–3.6)
MCH: 29.5 pg (ref 26.0–34.0)
MCHC: 33.1 g/dL (ref 32.0–36.0)
MCV: 89 fL (ref 80.0–100.0)
Monocytes Absolute: 0.9 10*3/uL (ref 0.2–0.9)
Monocytes Relative: 10 %
NEUTROS ABS: 5.7 10*3/uL (ref 1.4–6.5)
NEUTROS PCT: 60 %
PLATELETS: 297 10*3/uL (ref 150–440)
RBC: 4.79 MIL/uL (ref 3.80–5.20)
RDW: 13.2 % (ref 11.5–14.5)
WBC: 9.3 10*3/uL (ref 3.6–11.0)

## 2015-01-25 NOTE — Progress Notes (Signed)
Pt has never smoked.  Does not have living will.

## 2015-01-26 ENCOUNTER — Encounter: Payer: Self-pay | Admitting: Oncology

## 2015-01-26 DIAGNOSIS — I10 Essential (primary) hypertension: Secondary | ICD-10-CM | POA: Insufficient documentation

## 2015-01-26 DIAGNOSIS — F32A Depression, unspecified: Secondary | ICD-10-CM | POA: Insufficient documentation

## 2015-01-26 DIAGNOSIS — F329 Major depressive disorder, single episode, unspecified: Secondary | ICD-10-CM | POA: Insufficient documentation

## 2015-01-26 DIAGNOSIS — F419 Anxiety disorder, unspecified: Secondary | ICD-10-CM | POA: Insufficient documentation

## 2015-01-26 DIAGNOSIS — E559 Vitamin D deficiency, unspecified: Secondary | ICD-10-CM | POA: Insufficient documentation

## 2015-01-26 DIAGNOSIS — C50912 Malignant neoplasm of unspecified site of left female breast: Secondary | ICD-10-CM | POA: Insufficient documentation

## 2015-01-26 NOTE — Progress Notes (Signed)
[place] @ [facility] [phone]  Fax:([phone]) [place]: [birth year]  MR#: [id number]  CVE#:[id number]  Patient Care Team: No Pcp Per Patient as PCP - General (General Practice)  CHIEF COMPLAINT:  Chief Complaint  Patient presents with  . Follow-up    Oncology History   1. Carcinoma of left breast stage Ic T1 C. N0 M0 tumor  Estrogen receptor +2  Progesterone receptor positive  HER-2/neu receptor negative by Fish patient has been ruled out to do mastectomy or radiation therapy STATUS POST LUMPECTOMY IN August of 2013 2. Oncotype score is low 3. Patient has been started on Arimidex from October, 2013     Cancer of left breast   04/27/2012 Initial Diagnosis Cancer of left breast    No flowsheet data found.  INTERVAL HISTORY: 64 year old lady with a history of carcinoma breast board and underwent lumpectomy and see Any DefiniteMastectomy or Radiation Therapy because of underlying problem and voluminous breast. Patient had been evaluated by surgeon as well as by radiation oncologist Tolerating entire hormonal therapy No bony pains no bony fractures. Getting regular mammograms done REVIEW OF SYSTE Gen. status: Obese lady.  Not any acute distress HEENT: No headache dizziness or soreness in the mouth Lungs: No cough.  Shortness of breath on exertion Cardiac: No chest pain GI: No nausea no vomiting or diarrhea GU: No dysuria hematuria Skin: No rash Neurologic persisting no headache or dizziness Lower extremity swelling 1+  As per HPI. Otherwise, a complete review of systems is negatve.  PAST MEDICAL HISTORY: Past Medical History  Diagnosis Date  . Breast cancer   . Renal insufficiency   . Anxiety   . Hypertension   . Cancer of left breast 01/26/2015    PAST SURGICAL HISTORY: Past Surgical History  Procedure Laterality Date  . Breast lumpectomy Left   . Dilation and curettage of uterus    . Biopsy breast Left     FAMILY  HISTORY Significant History/PMH:   Kidney Insuffiency:    Cancer Left Breast:    Anxiety:    Hypertension:    Left Lumpectomy: Aug 2013   D&C X 2:    Breast biopsy: 30-Mar-2012  Preventive Screening:  Has patient had any of the following test? Colonscopy  Mammography (1)   Last Colonoscopy: 2009(1)   Last Mammography: Oct 2014(1)   Smoking History: Smoking History Never Smoked.(1)  PFSH: Family History: noncontributory  Social History: negative alcohol, negative tobacco  Additional Past Medical and Surgical History: As mentioned above   GYNECOLOGIC HISTORY:  No LMP recorded.     ADVANCED DIRECTIVES:    HEALTH MAINTENANCE: History  Substance Use Topics  . Smoking status: Never Smoker   . Smokeless tobacco: Not on file  . Alcohol Use: Not on file      Allergies  Allergen Reactions  . Tramadol Hcl Other (See Comments)  . Ibuprofen Other (See Comments)    Due to kidneys  . Levaquin [Levofloxacin] Rash  . Tape Rash    Current Outpatient Prescriptions  Medication Sig Dispense Refill  . anastrozole (ARIMIDEX) 1 MG tablet Take 1 mg by mouth daily.    Marland Kitchen atenolol (TENORMIN) 50 MG tablet Take 50 mg by mouth daily.    . calcium carbonate (OS-CAL) 600 MG TABS tablet Take 600 mg by mouth 2 (two) times daily with a meal.    . HYDROcodone-acetaminophen (NORCO/VICODIN) 5-325 MG per tablet Take 1-2 tablets by mouth every 6 (six) hours as needed  for severe pain.    . Melatonin 1 MG CAPS Take 1 capsule by mouth.    . Multiple Vitamins-Minerals (CENTRUM SILVER PO) Take 1 capsule by mouth.    . nystatin (MYCOSTATIN) powder Apply topically.    . triamterene-hydrochlorothiazide (DYAZIDE) 37.5-25 MG per capsule Take 1 capsule by mouth daily.    Marland Kitchen triamterene-hydrochlorothiazide (MAXZIDE-25) 37.5-25 MG per tablet Take by mouth.    . zolpidem (AMBIEN) 10 MG tablet Take 10 mg by mouth at bedtime as needed for sleep.     No current facility-administered medications for this  visit.    OBJECTIVE:  Filed Vitals:   01/25/15 1144  BP: 126/82  Pulse: 78  Temp: 94.7 F (34.8 C)     Body mass index is 30.23 kg/(m^2).    ECOG FS:1 - Symptomatic but completely ambulatory  PHYSICAL EXAM: GENERAL:  Well developed, well nourished, sitting comfortably in the exam room in no acute distress. MENTAL STATUS:  Alert and oriented to person, place and time. HEAD:    Normocephalic, atraumatic, face symmetric, no Cushingoid features. EYES:  .  Pupils equal round and reactive to light and accomodation.  No conjunctivitis or scleral icterus. ENT:  Oropharynx clear without lesion.  Tongue normal. Mucous membranes moist.  RESPIRATORY:  Clear to auscultation without rales, wheezes or rhonchi. CARDIOVASCULAR:  Regular rate and rhythm without murmur, rub or gallop. BREAST:  Right breast without masses, skin changes or nipple discharge.  Left breast without masses, skin changes or nipple discharge. ABDOMEN:  Soft, non-tender, with active bowel sounds, and no hepatosplenomegaly.  No masses. BACK:  No CVA tenderness.  No tenderness on percussion of the back or rib cage. SKIN:  No rashes, ulcers or lesions. EXTREMITIES: No edema, no skin discoloration or tenderness.  No palpable cords. LYMPH NODES: No palpable cervical, supraclavicular, axillary or inguinal adenopathy  NEUROLOGICAL: Unremarkable. PSYCH:  Appropriate.   LAB RESULTS:  Appointment on 01/25/2015  Component Date Value Ref Range Status  . WBC 01/25/2015 9.3  3.6 - 11.0 K/uL Final  . RBC 01/25/2015 4.79  3.80 - 5.20 MIL/uL Final  . Hemoglobin 01/25/2015 14.1  12.0 - 16.0 g/dL Final  . HCT 01/25/2015 42.6  35.0 - 47.0 % Final  . MCV 01/25/2015 89.0  80.0 - 100.0 fL Final  . MCH 01/25/2015 29.5  26.0 - 34.0 pg Final  . MCHC 01/25/2015 33.1  32.0 - 36.0 g/dL Final  . RDW 01/25/2015 13.2  11.5 - 14.5 % Final  . Platelets 01/25/2015 297  150 - 440 K/uL Final  . Neutrophils Relative % 01/25/2015 60   Final  . Neutro Abs  01/25/2015 5.7  1.4 - 6.5 K/uL Final  . Lymphocytes Relative 01/25/2015 24   Final  . Lymphs Abs 01/25/2015 2.2  1.0 - 3.6 K/uL Final  . Monocytes Relative 01/25/2015 10   Final  . Monocytes Absolute 01/25/2015 0.9  0.2 - 0.9 K/uL Final  . Eosinophils Relative 01/25/2015 5   Final  . Eosinophils Absolute 01/25/2015 0.4  0 - 0.7 K/uL Final  . Basophils Relative 01/25/2015 1   Final  . Basophils Absolute 01/25/2015 0.1  0 - 0.1 K/uL Final  . Sodium 01/25/2015 137  135 - 145 mmol/L Final  . Potassium 01/25/2015 4.4  3.5 - 5.1 mmol/L Final  . Chloride 01/25/2015 98* 101 - 111 mmol/L Final  . CO2 01/25/2015 33* 22 - 32 mmol/L Final  . Glucose, Bld 01/25/2015 114* 65 - 99 mg/dL Final  . BUN  01/25/2015 16  6 - 20 mg/dL Final  . Creatinine, Ser 01/25/2015 1.19* 0.44 - 1.00 mg/dL Final  . Calcium 01/25/2015 9.7  8.9 - 10.3 mg/dL Final  . Total Protein 01/25/2015 7.9  6.5 - 8.1 g/dL Final  . Albumin 01/25/2015 4.7  3.5 - 5.0 g/dL Final  . AST 01/25/2015 20  15 - 41 U/L Final  . ALT 01/25/2015 16  14 - 54 U/L Final  . Alkaline Phosphatase 01/25/2015 89  38 - 126 U/L Final  . Total Bilirubin 01/25/2015 0.7  0.3 - 1.2 mg/dL Final  . GFR calc non Af Amer 01/25/2015 47* >60 mL/min Final  . GFR calc Af Amer 01/25/2015 55* >60 mL/min Final   Comment: (NOTE) The eGFR has been calculated using the CKD EPI equation. This calculation has not been validated in all clinical situations. eGFR's persistently <60 mL/min signify possible Chronic Kidney Disease.   . Anion gap 01/25/2015 6  5 - 15 Final    Lab Results  Component Value Date   LABCA2 17.3 07/19/2014   No results found for: CA199 No results found for: CEA No results found for: PSA No results found for: CA125   STUDIES: No results found.  ASSESSMENT: Carcinoma breast stage IC at present time there is no evidence of recurrent disease  MEDICAL DECISION MAKING:  We will continue anastrozole calcium and vitamin Repeat  mammogram has  been scheduled  clinical examination there is no evidence of recurrent disease  Patient expressed understanding and was in agreement with this plan. She also understands that She can call clinic at any time with any questions, concerns, or complaints.    Cancer of left breast   Staging form: Breast, AJCC 7th Edition     Clinical: Stage IA (T1c, N0, M0) - Signed by Forest Gleason, MD on 01/26/2015   Forest Gleason, MD   01/26/2015 1:18 PM

## 2015-07-04 ENCOUNTER — Other Ambulatory Visit: Payer: Self-pay | Admitting: Oncology

## 2015-07-04 DIAGNOSIS — M949 Disorder of cartilage, unspecified: Principal | ICD-10-CM

## 2015-07-04 DIAGNOSIS — M899 Disorder of bone, unspecified: Secondary | ICD-10-CM

## 2015-07-12 ENCOUNTER — Encounter: Payer: Self-pay | Admitting: *Deleted

## 2015-07-12 ENCOUNTER — Emergency Department
Admission: EM | Admit: 2015-07-12 | Discharge: 2015-07-12 | Disposition: A | Discharge status: Home / Self Care | Attending: Emergency Medicine | Admitting: Emergency Medicine

## 2015-07-12 DIAGNOSIS — I10 Essential (primary) hypertension: Secondary | ICD-10-CM | POA: Diagnosis not present

## 2015-07-12 DIAGNOSIS — K047 Periapical abscess without sinus: Secondary | ICD-10-CM | POA: Insufficient documentation

## 2015-07-12 DIAGNOSIS — K0889 Other specified disorders of teeth and supporting structures: Secondary | ICD-10-CM | POA: Diagnosis present

## 2015-07-12 DIAGNOSIS — Z79899 Other long term (current) drug therapy: Secondary | ICD-10-CM | POA: Insufficient documentation

## 2015-07-12 DIAGNOSIS — K029 Dental caries, unspecified: Secondary | ICD-10-CM | POA: Diagnosis not present

## 2015-07-12 MED ORDER — AMOXICILLIN 500 MG PO CAPS
ORAL_CAPSULE | ORAL | Status: AC
  Filled 2015-07-12: qty 1

## 2015-07-12 MED ORDER — AMOXICILLIN 500 MG PO TABS: 500.0000 mg | ORAL_TABLET | Freq: Three times a day (TID) | ORAL | Status: DC

## 2015-07-12 MED ORDER — HYDROCODONE-ACETAMINOPHEN 5-325 MG PO TABS: 1.0000 | ORAL_TABLET | ORAL | Status: DC | PRN

## 2015-07-12 MED ORDER — AMOXICILLIN 500 MG PO CAPS
500.0000 mg | ORAL_CAPSULE | Freq: Three times a day (TID) | ORAL | Status: DC
Start: 2015-07-12 — End: 2015-07-12
  Administered 2015-07-12: 500 mg via ORAL

## 2015-07-12 NOTE — Discharge Instructions (Signed)
Dental Abscess A dental abscess is a collection of pus in or around a tooth. CAUSES This condition is caused by a bacterial infection around the root of the tooth that involves the inner part of the tooth (pulp). It may result from:  Severe tooth decay.  Trauma to the tooth that allows bacteria to enter into the pulp, such as a broken or chipped tooth.  Severe gum disease around a tooth. SYMPTOMS Symptoms of this condition include:  Severe pain in and around the infected tooth.  Swelling and redness around the infected tooth, in the mouth, or in the face.  Tenderness.  Pus drainage.  Bad breath.  Bitter taste in the mouth.  Difficulty swallowing.  Difficulty opening the mouth.  Nausea.  Vomiting.  Chills.  Swollen neck glands.  Fever. DIAGNOSIS This condition is diagnosed with examination of the infected tooth. During the exam, your dentist may tap on the infected tooth. Your dentist will also ask about your medical and dental history and may order X-rays. TREATMENT This condition is treated by eliminating the infection. This may be done with:  Antibiotic medicine.  A root canal. This may be performed to save the tooth.  Pulling (extracting) the tooth. This may also involve draining the abscess. This is done if the tooth cannot be saved. HOME CARE INSTRUCTIONS  Take medicines only as directed by your dentist.  If you were prescribed antibiotic medicine, finish all of it even if you start to feel better.  Rinse your mouth (gargle) often with salt water to relieve pain or swelling.  Do not drive or operate heavy machinery while taking pain medicine.  Do not apply heat to the outside of your mouth.  Keep all follow-up visits as directed by your dentist. This is important. SEEK MEDICAL CARE IF:  Your pain is worse and is not helped by medicine. SEEK IMMEDIATE MEDICAL CARE IF:  You have a fever or chills.  Your symptoms suddenly get worse.  You have a  very bad headache.  You have problems breathing or swallowing.  You have trouble opening your mouth.  You have swelling in your neck or around your eye.   This information is not intended to replace advice given to you by your health care provider. Make sure you discuss any questions you have with your health care provider.   OPTIONS FOR DENTAL FOLLOW UP CARE  [facility] [place] [email or web address] [phone])  [facility] [phone])  [place] [phone])  [place] [phone])  [facility] [phone]) This clinic caters to the indigent population and is on a lottery system. Location: [employer] [facility], [place], [place], [place] Clinic Hours: Wednesdays from 6pm - 9pm, patients seen by a lottery system. For dates, call or go to [email or web address] Services: Cleanings, fillings and simple extractions. Payment Options: DENTAL WORK IS FREE OF CHARGE. Bring proof of income or support. Best way to get seen: Arrive at 5:15 pm - this is a lottery, NOT first come/first serve, so arriving earlier will not increase your chances of being seen.     [facility] [phone] Select option 1 for emergencies   Location: [facility], [place], [address], [place] Clinic Hours: No walk-ins accepted - call the day before to schedule an appointment. Check in times are 9:30 am and 1:30  pm. Services: Simple extractions, temporary fillings, pulpectomy/pulp debridement, uncomplicated abscess drainage. Payment Options: PAYMENT IS DUE AT THE TIME OF SERVICE.  Fee is usually $100-200, additional surgical procedures (e.g. abscess drainage) may be extra. Cash, checks, Visa/MasterCard accepted.   Can file Medicaid if patient is covered for dental - patient should call case worker to check. No discount for [facility] patients. Best way to get seen: MUST call the day before and get onto the schedule. Can usually be seen the next 1-2 days. No walk-ins accepted.     [place] [phone]   Location: [place], [place] [facility] Hours: M, W, Th, F 8am or 1:30pm, Tues 9a or 1:30 - first come/first served. Services: Simple extractions, temporary fillings, uncomplicated abscess drainage.  You do not need to be an [facility] resident. Payment Options: PAYMENT IS DUE AT THE TIME OF SERVICE. Dental insurance, otherwise sliding scale - bring proof of income or support. Depending on income and treatment needed, cost is usually $50-200. Best way to get seen: Arrive early as it is first come/first served.     [facility] [phone]   Location: [place] [facility] Hours: Mon-Thu 8a-5p Services: Most basic dental services including extractions and fillings. Payment Options: PAYMENT IS DUE AT THE TIME OF SERVICE. Sliding scale, up to 50% off - bring proof if income or support. Medicaid with dental option accepted. Best way to get seen: Call to schedule an appointment, can usually be seen within 2 weeks OR they will try to see walk-ins - show up at [place] or 2p (you may have to wait).     [facility] [place] RESIDENTS ONLY   Location: [facility] [place] [address] [place], [place] [address] Clinic Hours: By appointment only. Monday - Thursday 8am-5pm, Friday 8am-12pm Services: Cleanings, fillings, extractions. Payment Options: PAYMENT IS DUE AT THE TIME OF SERVICE. Cash, Visa or MasterCard. Sliding scale - $30 minimum per service. Best way to get seen: Come in to office, complete packet and make an appointment - need  proof of income or support monies for each household member and proof of [facility] residence. Usually takes about a month to get in.     [facility] [phone]   Location: [address], [place] Clinic Hours: Walk-in Urgent Care Dental Services are offered Monday-Friday mornings only. The numbers of emergencies accepted daily is limited to the number of providers available. Maximum 15 - Mondays, Wednesdays & Thursdays Maximum 10 - Tuesdays & Fridays Services: You do not need to be a [facility] resident to be seen for a dental emergency. Emergencies are defined as pain, swelling, abnormal bleeding, or dental trauma. Walkins will receive x-rays if needed. NOTE: Dental cleaning is not an emergency. Payment Options: PAYMENT IS DUE AT THE TIME OF SERVICE. Minimum co-pay is $40.00 for uninsured patients. Minimum co-pay is $3.00 for Medicaid with dental coverage. Dental Insurance is accepted and must be presented at time of visit. Medicare does not cover dental. Forms of payment: Cash, credit card, checks. Best way to get seen: If not previously registered with the clinic, walk-in dental registration begins at 7:15 am and is on a first come/first serve basis. If previously registered with the clinic, call to make an appointment.     [facility] [place] ONLY   Location: [address] [place], [place] Clinic Hours: Mon-Thu  10a-2p Services: Extractions only! Payment Options: FREE (donations accepted) - bring proof of income or support Best way to get seen: Call and schedule an appointment OR come at 8am on the 1st Monday of every month (except for holidays) when it is first come/first served.     [facility] [phone]   Location: [place], [place] Clinic Hours: Friday mornings Services, Payment Options, Best way to get seen: Call for info    Document Released: 08/18/2005  Document Revised: 01/02/2015 Document Reviewed: 08/15/2014 Elsevier Interactive Patient Education 2016 [employer].

## 2015-07-12 NOTE — ED Notes (Signed)
Pt c/o left side dental pain and swelling for two days. Swelling noted to left side of jaw. 7/10 pain, worse when eating.  Pt unable to see dentist due to financial reasons.

## 2015-07-12 NOTE — ED Notes (Signed)
Pt reports a tooth abscess on left side, pt has swelling to left side of face,

## 2015-07-12 NOTE — ED Provider Notes (Signed)
[facility] Emergency Department Provider Note ____________________________________________  Time seen: Approximately 5:41 PM  I have reviewed the triage vital signs and the nursing notes.   HISTORY  Chief Complaint Dental Pain   HPI Hayley Newman is a 64 y.o. female who presents to the emergency department for evaluation of dental pain and facial swelling. Pain and swelling started 2 days ago. She has had no relief with ice or ibuprofen. She is unable to see a dentist due to lack of insurance and living on a fixed income.    Past Medical History  Diagnosis Date  . Breast cancer ([place])   . Renal insufficiency   . Anxiety   . Hypertension   . Cancer of left breast ([place]) 01/26/2015    Patient Active Problem List   Diagnosis Date Noted  . Cancer of left breast ([place]) 01/26/2015  . Anxiety 01/26/2015  . Clinical depression 01/26/2015  . BP (high blood pressure) 01/26/2015  . Avitaminosis D 01/26/2015  . Chronic kidney disease, stage III (moderate) 08/01/2014  . Borderline diabetes 01/27/2014  . Cannot sleep 01/26/2014    Past Surgical History  Procedure Laterality Date  . Breast lumpectomy Left   . Dilation and curettage of uterus    . Biopsy breast Left     Current Outpatient Rx  Name  Route  Sig  Dispense  Refill  . amoxicillin (AMOXIL) 500 MG tablet   Oral   Take 1 tablet (500 mg total) by mouth 3 (three) times daily.   30 tablet   0   . anastrozole (ARIMIDEX) 1 MG tablet   Oral   Take 1 mg by mouth daily.         Marland Kitchen atenolol (TENORMIN) 50 MG tablet   Oral   Take 50 mg by mouth daily.         . calcium carbonate (OS-CAL) 600 MG TABS tablet   Oral   Take 600 mg by mouth 2 (two) times daily with a meal.         . HYDROcodone-acetaminophen (NORCO/VICODIN) 5-325 MG tablet   Oral   Take 1 tablet by mouth every 4 (four) hours as needed.   12 tablet   0   . Melatonin 1 MG CAPS   Oral   Take 1 capsule by mouth.         . Multiple Vitamins-Minerals (CENTRUM SILVER PO)   Oral   Take 1 capsule by mouth.         . nystatin (MYCOSTATIN) powder   Topical   Apply topically.         . triamterene-hydrochlorothiazide (DYAZIDE) 37.5-25 MG per capsule   Oral   Take 1 capsule by mouth daily.         Marland Kitchen triamterene-hydrochlorothiazide (MAXZIDE-25) 37.5-25 MG per tablet   Oral   Take by mouth.         . zolpidem (AMBIEN) 10 MG tablet   Oral   Take 10 mg by mouth at bedtime as needed for sleep.           Allergies Tramadol hcl; Ibuprofen; Levaquin; and Tape  No family history on file.  Social History Social History  Substance Use Topics  . Smoking status: Never Smoker   . Smokeless tobacco: None  . Alcohol Use: None    Review of Systems Constitutional: No fever/chills Eyes: No visual changes. ENT: No sore throat. Positive for dental pain and facial swelling. Cardiovascular: Denies chest pain. Respiratory: Denies  shortness of breath. Gastrointestinal: No abdominal pain.  No nausea, no vomiting.  Genitourinary: Negative for dysuria. Musculoskeletal: Negative for back pain. Skin: Negative for rash. Neurological: Negative for headaches, focal weakness or numbness. 10-point ROS otherwise negative.  ____________________________________________   PHYSICAL EXAM:  VITAL SIGNS: ED Triage Vitals  Enc Vitals Group     BP 07/12/15 1733 153/67 mmHg     Pulse Rate 07/12/15 1733 100     Resp 07/12/15 1733 20     Temp 07/12/15 1733 98 F (36.7 C)     Temp Source 07/12/15 1733 Oral     SpO2 07/12/15 1733 97 %     Weight 07/12/15 1733 180 lb (81.647 kg)     Height 07/12/15 1733 5\' 7"  (1.702 m)     Head Cir --      Peak Flow --      Pain Score 07/12/15 1728 5     Pain Loc --      Pain Edu? --      Excl. in [place]? --     Constitutional: Alert and oriented. Well appearing and in no acute distress. Eyes: Conjunctivae are normal. PERRL. EOMI. Head: Atraumatic. Nose: No  congestion/rhinnorhea. Mouth/Throat: Mucous membranes are moist.  Oropharynx non-erythematous. Periodontal Exam: Widespread dental decay with very poor dental hygiene. No induration of the palette. No abscess raised above the gum. Tenderness and swelling of the left lower jaw. Neck: No stridor.  Hematological/Lymphatic/Immunilogical: No cervical lymphadenopathy. Cardiovascular:   Good peripheral circulation. Respiratory: Normal respiratory effort.  No retractions. Musculoskeletal: No lower extremity tenderness nor edema.  No joint effusions. Neurologic:  Normal speech and language. No gross focal neurologic deficits are appreciated. Speech is normal. No gait instability. Skin:  Skin is warm, dry and intact. No rash noted. Psychiatric: Mood and affect are normal. Speech and behavior are normal.  ____________________________________________   LABS (all labs ordered are listed, but only abnormal results are displayed)  Labs Reviewed - No data to display ____________________________________________   RADIOLOGY  Not indicated. ____________________________________________   PROCEDURES  Procedure(s) performed: None  Critical Care performed: No  ____________________________________________   INITIAL IMPRESSION / ASSESSMENT AND PLAN / ED COURSE  Pertinent labs & imaging results that were available during my care of the patient were reviewed by me and considered in my medical decision making (see chart for details).  Patient was advised to see the dentist within 14 days. Also advised to take the antibiotic until finished. Instructed to return to the ER for symptoms that change or worsen if you are unable to schedule an appointment. Dental clinic list provided. ____________________________________________   FINAL CLINICAL IMPRESSION(S) / ED DIAGNOSES  Final diagnoses:  Dental abscess       Victorino Dike, FNP 07/12/15 [place], MD 07/12/15 2030

## 2015-07-16 ENCOUNTER — Ambulatory Visit
Admission: RE | Admit: 2015-07-16 | Discharge: 2015-07-16 | Disposition: A | Discharge status: Home / Self Care | Source: Ambulatory Visit | Attending: Oncology | Admitting: Oncology

## 2015-07-16 DIAGNOSIS — M8580 Other specified disorders of bone density and structure, unspecified site: Secondary | ICD-10-CM | POA: Insufficient documentation

## 2015-07-16 DIAGNOSIS — Z853 Personal history of malignant neoplasm of breast: Secondary | ICD-10-CM | POA: Diagnosis not present

## 2015-07-16 DIAGNOSIS — M949 Disorder of cartilage, unspecified: Principal | ICD-10-CM

## 2015-07-16 DIAGNOSIS — Z13820 Encounter for screening for osteoporosis: Secondary | ICD-10-CM | POA: Insufficient documentation

## 2015-07-16 DIAGNOSIS — Z780 Asymptomatic menopausal state: Secondary | ICD-10-CM | POA: Diagnosis not present

## 2015-07-16 DIAGNOSIS — M899 Disorder of bone, unspecified: Secondary | ICD-10-CM

## 2015-07-18 ENCOUNTER — Inpatient Hospital Stay: Attending: Oncology

## 2015-07-18 ENCOUNTER — Encounter: Payer: Self-pay | Admitting: Oncology

## 2015-07-18 ENCOUNTER — Inpatient Hospital Stay (HOSPITAL_BASED_OUTPATIENT_CLINIC_OR_DEPARTMENT_OTHER): Admitting: Oncology

## 2015-07-18 VITALS — BP 151/71 | HR 64 | Temp 95.9°F | Wt 179.0 lb

## 2015-07-18 DIAGNOSIS — C50912 Malignant neoplasm of unspecified site of left female breast: Secondary | ICD-10-CM

## 2015-07-18 DIAGNOSIS — E669 Obesity, unspecified: Secondary | ICD-10-CM | POA: Insufficient documentation

## 2015-07-18 DIAGNOSIS — Z170 Estrogen receptor positive status [ER+]: Secondary | ICD-10-CM | POA: Insufficient documentation

## 2015-07-18 DIAGNOSIS — M8580 Other specified disorders of bone density and structure, unspecified site: Secondary | ICD-10-CM | POA: Diagnosis not present

## 2015-07-18 DIAGNOSIS — C50919 Malignant neoplasm of unspecified site of unspecified female breast: Secondary | ICD-10-CM

## 2015-07-18 DIAGNOSIS — L0201 Cutaneous abscess of face: Secondary | ICD-10-CM | POA: Diagnosis not present

## 2015-07-18 DIAGNOSIS — R600 Localized edema: Secondary | ICD-10-CM | POA: Diagnosis not present

## 2015-07-18 DIAGNOSIS — F419 Anxiety disorder, unspecified: Secondary | ICD-10-CM | POA: Diagnosis not present

## 2015-07-18 DIAGNOSIS — E7800 Pure hypercholesterolemia, unspecified: Secondary | ICD-10-CM

## 2015-07-18 DIAGNOSIS — I10 Essential (primary) hypertension: Secondary | ICD-10-CM | POA: Diagnosis not present

## 2015-07-18 DIAGNOSIS — K0889 Other specified disorders of teeth and supporting structures: Secondary | ICD-10-CM | POA: Diagnosis not present

## 2015-07-18 DIAGNOSIS — Z23 Encounter for immunization: Secondary | ICD-10-CM | POA: Insufficient documentation

## 2015-07-18 DIAGNOSIS — Z780 Asymptomatic menopausal state: Secondary | ICD-10-CM | POA: Insufficient documentation

## 2015-07-18 DIAGNOSIS — R0609 Other forms of dyspnea: Secondary | ICD-10-CM | POA: Insufficient documentation

## 2015-07-18 DIAGNOSIS — Z79811 Long term (current) use of aromatase inhibitors: Secondary | ICD-10-CM | POA: Diagnosis not present

## 2015-07-18 DIAGNOSIS — Z79899 Other long term (current) drug therapy: Secondary | ICD-10-CM | POA: Diagnosis not present

## 2015-07-18 LAB — CBC WITH DIFFERENTIAL/PLATELET
BASOS ABS: 0 10*3/uL (ref 0–0.1)
Basophils Relative: 1 %
Eosinophils Absolute: 0.2 10*3/uL (ref 0–0.7)
Eosinophils Relative: 3 %
HEMATOCRIT: 41.1 % (ref 35.0–47.0)
Hemoglobin: 13.6 g/dL (ref 12.0–16.0)
LYMPHS ABS: 1.7 10*3/uL (ref 1.0–3.6)
LYMPHS PCT: 30 %
MCH: 29.2 pg (ref 26.0–34.0)
MCHC: 33 g/dL (ref 32.0–36.0)
MCV: 88.4 fL (ref 80.0–100.0)
Monocytes Absolute: 0.4 10*3/uL (ref 0.2–0.9)
Monocytes Relative: 8 %
NEUTROS ABS: 3.3 10*3/uL (ref 1.4–6.5)
Neutrophils Relative %: 58 %
Platelets: 243 10*3/uL (ref 150–440)
RBC: 4.65 MIL/uL (ref 3.80–5.20)
RDW: 12.3 % (ref 11.5–14.5)
WBC: 5.7 10*3/uL (ref 3.6–11.0)

## 2015-07-18 LAB — COMPREHENSIVE METABOLIC PANEL
ALK PHOS: 66 U/L (ref 38–126)
ALT: 13 U/L — AB (ref 14–54)
AST: 16 U/L (ref 15–41)
Albumin: 4.1 g/dL (ref 3.5–5.0)
Anion gap: 5 (ref 5–15)
BILIRUBIN TOTAL: 0.7 mg/dL (ref 0.3–1.2)
BUN: 21 mg/dL — AB (ref 6–20)
CALCIUM: 9.2 mg/dL (ref 8.9–10.3)
CO2: 32 mmol/L (ref 22–32)
CREATININE: 1.15 mg/dL — AB (ref 0.44–1.00)
Chloride: 99 mmol/L — ABNORMAL LOW (ref 101–111)
GFR calc Af Amer: 57 mL/min — ABNORMAL LOW (ref >60)
GFR, EST NON AFRICAN AMERICAN: 49 mL/min — AB (ref >60)
Glucose, Bld: 134 mg/dL — ABNORMAL HIGH (ref 65–99)
Potassium: 4.1 mmol/L (ref 3.5–5.1)
Sodium: 136 mmol/L (ref 135–145)
TOTAL PROTEIN: 7.3 g/dL (ref 6.5–8.1)

## 2015-07-18 MED ORDER — ANASTROZOLE 1 MG PO TABS: 1.0000 mg | ORAL_TABLET | Freq: Every day | ORAL | Status: DC

## 2015-07-18 MED ORDER — INFLUENZA VAC SPLIT QUAD 0.5 ML IM SUSY
0.5000 mL | PREFILLED_SYRINGE | Freq: Once | INTRAMUSCULAR | Status: AC
  Administered 2015-07-18: 0.5 mL via INTRAMUSCULAR

## 2015-07-18 NOTE — Progress Notes (Signed)
[place] @ [facility] Telephone:([phone]) [phone]  Fax:([phone]) [place]: Jan 01, 1951  MR#: [id number]  QIH#:[id number]  Patient Care Team: No Pcp Per Patient as PCP - General (General Practice)  CHIEF COMPLAINT:  Chief Complaint  Patient presents with  . OTHER    Oncology History   1. Carcinoma of left breast stage Ic T1 C. N0 M0 tumor  Estrogen receptor +2  Progesterone receptor positive  HER-2/neu receptor negative by Fish patient has been ruled out to do mastectomy or radiation therapy STATUS POST LUMPECTOMY IN August of 2013 2. Oncotype score is low 3. Patient has been started on Arimidex from October, 2013     Cancer of left breast [facility])   04/27/2012 Initial Diagnosis Cancer of left breast    No flowsheet data found.  INTERVAL HISTORY: 64 year old lady with a history of carcinoma breast board and underwent lumpectomy and see Any DefiniteMastectomy or Radiation Therapy because of underlying problem and voluminous breast. Patient had been evaluated by surgeon as well as by radiation oncologist Tolerating entire hormonal therapy No bony pains no bony fractures. Patient has a very poor dental hygiene and cannot get dental care because of lack of insurance. Recently had an abscess on the face and went to emergency room Patient is tolerating entire hormonal therapy Getting regular mammograms done REVIEW OF SYSTE Gen. status: Obese lady.  Not any acute distress HEENT: No headache dizziness or soreness in the mouth Lungs: No cough.  Shortness of breath on exertion Cardiac: No chest pain GI: No nausea no vomiting or diarrhea GU: No dysuria hematuria Skin: No rash Neurologic persisting no headache or dizziness Lower extremity swelling 1+  As per HPI. Otherwise, a complete review of systems is negatve.  PAST MEDICAL HISTORY: Past Medical History  Diagnosis Date  . Breast cancer ([place])   . Renal insufficiency   . Anxiety   . Hypertension   .  Cancer of left breast ([place]) 01/26/2015    PAST SURGICAL HISTORY: Past Surgical History  Procedure Laterality Date  . Breast lumpectomy Left   . Dilation and curettage of uterus    . Biopsy breast Left   . Breast biopsy Left 2013    FAMILY HISTORY Significant History/PMH:   Kidney Insuffiency:    Cancer Left Breast:    Anxiety:    Hypertension:    Left Lumpectomy: Aug 2013   D&C X 2:    Breast biopsy: 30-Mar-2012  Preventive Screening:  Has patient had any of the following test? Colonscopy  Mammography (1)   Last Colonoscopy: 2009(1)   Last Mammography: Oct 2014(1)   Smoking History: Smoking History Never Smoked.(1)  PFSH: Family History: noncontributory  Social History: negative alcohol, negative tobacco  Additional Past Medical and Surgical History: As mentioned above   GYNECOLOGIC HISTORY:  No LMP recorded. Patient is postmenopausal.     ADVANCED DIRECTIVES:    HEALTH MAINTENANCE: Social History  Substance Use Topics  . Smoking status: Never Smoker   . Smokeless tobacco: None  . Alcohol Use: None      Allergies  Allergen Reactions  . Tramadol Hcl Other (See Comments)  . Ibuprofen Other (See Comments)    Due to kidneys  . Acetaminophen Other (See Comments)  . Levaquin [Levofloxacin] Rash  . Tape Rash    Current Outpatient Prescriptions  Medication Sig Dispense Refill  . amoxicillin (AMOXIL) 500 MG tablet Take 1 tablet (500 mg total) by mouth 3 (three) times daily. 30 tablet 0  .  anastrozole (ARIMIDEX) 1 MG tablet Take 1 mg by mouth daily.    Marland Kitchen atenolol (TENORMIN) 50 MG tablet Take 50 mg by mouth daily.    . calcium carbonate (OS-CAL) 600 MG TABS tablet Take 600 mg by mouth 2 (two) times daily with a meal.    . Melatonin 1 MG CAPS Take 1 capsule by mouth.    . Multiple Vitamins-Minerals (CENTRUM SILVER PO) Take 1 capsule by mouth.    . nystatin (MYCOSTATIN) powder Apply topically.    . triamterene-hydrochlorothiazide (DYAZIDE) 37.5-25 MG  per capsule Take 1 capsule by mouth daily.    Marland Kitchen triamterene-hydrochlorothiazide (MAXZIDE-25) 37.5-25 MG per tablet Take by mouth.     No current facility-administered medications for this visit.    OBJECTIVE:  Filed Vitals:   07/18/15 1055  BP: 151/71  Pulse: 64  Temp: 95.9 F (35.5 C)     Body mass index is 28.03 kg/(m^2).    ECOG FS:1 - Symptomatic but completely ambulatory  PHYSICAL EXAM: GENERAL:  Well developed, well nourished, sitting comfortably in the exam room in no acute distress. MENTAL STATUS:  Alert and oriented to person, place and time. HEAD:    Normocephalic, atraumatic, face symmetric, no Cushingoid features. EYES:  .  Pupils equal round and reactive to light and accomodation.  No conjunctivitis or scleral icterus. ENT:  Oropharynx clear without lesion.  Tongue normal. Mucous membranes moist.  RESPIRATORY:  Clear to auscultation without rales, wheezes or rhonchi. CARDIOVASCULAR:  Regular rate and rhythm without murmur, rub or gallop. BREAST:  Right breast without masses, skin changes or nipple discharge.  Left breast without masses, skin changes or nipple discharge. ABDOMEN:  Soft, non-tender, with active bowel sounds, and no hepatosplenomegaly.  No masses. BACK:  No CVA tenderness.  No tenderness on percussion of the back or rib cage. SKIN:  No rashes, ulcers or lesions. EXTREMITIES: No edema, no skin discoloration or tenderness.  No palpable cords. LYMPH NODES: No palpable cervical, supraclavicular, axillary or inguinal adenopathy  NEUROLOGICAL: Unremarkable. PSYCH:  Appropriate.   LAB RESULTS:  Appointment on 07/18/2015  Component Date Value Ref Range Status  . WBC 07/18/2015 5.7  3.6 - 11.0 K/uL Final  . RBC 07/18/2015 4.65  3.80 - 5.20 MIL/uL Final  . Hemoglobin 07/18/2015 13.6  12.0 - 16.0 g/dL Final  . HCT 07/18/2015 41.1  35.0 - 47.0 % Final  . MCV 07/18/2015 88.4  80.0 - 100.0 fL Final  . MCH 07/18/2015 29.2  26.0 - 34.0 pg Final  . MCHC  07/18/2015 33.0  32.0 - 36.0 g/dL Final  . RDW 07/18/2015 12.3  11.5 - 14.5 % Final  . Platelets 07/18/2015 243  150 - 440 K/uL Final  . Neutrophils Relative % 07/18/2015 58   Final  . Neutro Abs 07/18/2015 3.3  1.4 - 6.5 K/uL Final  . Lymphocytes Relative 07/18/2015 30   Final  . Lymphs Abs 07/18/2015 1.7  1.0 - 3.6 K/uL Final  . Monocytes Relative 07/18/2015 8   Final  . Monocytes Absolute 07/18/2015 0.4  0.2 - 0.9 K/uL Final  . Eosinophils Relative 07/18/2015 3   Final  . Eosinophils Absolute 07/18/2015 0.2  0 - 0.7 K/uL Final  . Basophils Relative 07/18/2015 1   Final  . Basophils Absolute 07/18/2015 0.0  0 - 0.1 K/uL Final  . Sodium 07/18/2015 136  135 - 145 mmol/L Final  . Potassium 07/18/2015 4.1  3.5 - 5.1 mmol/L Final  . Chloride 07/18/2015 99* 101 - 111  mmol/L Final  . CO2 07/18/2015 32  22 - 32 mmol/L Final  . Glucose, Bld 07/18/2015 134* 65 - 99 mg/dL Final  . BUN 07/18/2015 21* 6 - 20 mg/dL Final  . Creatinine, Ser 07/18/2015 1.15* 0.44 - 1.00 mg/dL Final  . Calcium 07/18/2015 9.2  8.9 - 10.3 mg/dL Final  . Total Protein 07/18/2015 7.3  6.5 - 8.1 g/dL Final  . Albumin 07/18/2015 4.1  3.5 - 5.0 g/dL Final  . AST 07/18/2015 16  15 - 41 U/L Final  . ALT 07/18/2015 13* 14 - 54 U/L Final  . Alkaline Phosphatase 07/18/2015 66  38 - 126 U/L Final  . Total Bilirubin 07/18/2015 0.7  0.3 - 1.2 mg/dL Final  . GFR calc non Af Amer 07/18/2015 49* >60 mL/min Final  . GFR calc Af Amer 07/18/2015 57* >60 mL/min Final   Comment: (NOTE) The eGFR has been calculated using the CKD EPI equation. This calculation has not been validated in all clinical situations. eGFR's persistently <60 mL/min signify possible Chronic Kidney Disease.   . Anion gap 07/18/2015 5  5 - 15 Final    Lab Results  Component Value Date   LABCA2 17.3 07/19/2014     STUDIES: Dg Bone Density  07/16/2015  EXAM: DUAL X-RAY ABSORPTIOMETRY (DXA) FOR BONE MINERAL DENSITY IMPRESSION: Dear Dr. Oliva Bustard, Your patient  Juli Odom completed a BMD test on 07/16/2015 using the [place] (analysis version: 14.10) manufactured by [employer]. The following summarizes the results of our evaluation. PATIENT BIOGRAPHICAL: Name: Autumnrose, Yore Patient ID: [id number] Birth Date: April 22, 1951 Height: 62.0 in. Gender: Female Exam Date: 07/16/2015 Weight: 175.0 lbs. Indications: Breast CA, Height Loss, High Risk Meds, Postmenopausal Fractures: Treatments: CALCIUM VIT D, letrozole ASSESSMENT: The BMD measured at Forearm Radius 33% is 0.667 g/cm2 with a T-score of -2.4. This patient is considered osteopenic according to [facility]) criteria. Lumbar spine was not utilized due to scoliosis. Site Region Measured Measured WHO Young Adult BMD Date       Age      Classification T-score DualFemur Neck Left 07/16/2015 64.6 Osteopenia -2.1 0.752 g/cm2 Left Forearm Radius 33% 07/16/2015 64.6 Osteopenia -2.4 0.667 g/cm2 World Health Organization [facility]) criteria for post-menopausal, Caucasian Women: Normal:       T-score at or above -1 SD Osteopenia:   T-score between -1 and -2.5 SD Osteoporosis: T-score at or below -2.5 SD RECOMMENDATIONS: [place] recommends that FDA-approved medical therapies be considered in postmenopausal women and men age 54 or older with a: 1. Hip or vertebral (clinical or morphometric) fracture. 2. T-score of < -2.5 at the spine or hip. 3. Ten-year fracture probability by FRAX of 3% or greater for hip fracture or 20% or greater for major osteoporotic fracture. All treatment decisions require clinical judgment and consideration of individual patient factors, including patient preferences, co-morbidities, previous drug use, risk factors not captured in the FRAX model (e.g. falls, vitamin D deficiency, increased bone turnover, interval significant decline in bone density) and possible under - or over-estimation of fracture risk by FRAX. All patients should ensure an  adequate intake of dietary calcium (1200 mg/d) and vitamin D (800 IU daily) unless contraindicated. FOLLOW-UP: People with diagnosed cases of osteoporosis or at high risk for fracture should have regular bone mineral density tests. For patients eligible for Medicare, routine testing is allowed once every 2 years. The testing frequency can be increased to one year for patients who have rapidly progressing disease, those  who are receiving or discontinuing medical therapy to restore bone mass, or have additional risk factors. I have reviewed this report, and agree with the above findings. [facility] Dear Dr. Oliva Bustard, Your patient JAMARIS BIERNAT completed a FRAX assessment on 07/16/2015 using the [place] (analysis version: 14.10) manufactured by [employer]. The following summarizes the results of our evaluation. PATIENT BIOGRAPHICAL: Name: Revia, Nghiem Patient ID: [id number] Birth Date: 05/30/1951 Height:    62.0 in. Gender:     Female    [age]       Weight:    175.0 lbs. Ethnicity:  White                            Exam Date: 07/16/2015 FRAX* RESULTS:  (version: 3.5) 10-year Probability of Fracture1 Major Osteoporotic Fracture2 Hip Fracture 10.4% 1.6% Population: [place] (Caucasian) Risk Factors: None Based on Femur (Left) Neck BMD 1 -The 10-year probability of fracture may be lower than reported if the patient has received treatment. 2 -Major Osteoporotic Fracture: Clinical Spine, Forearm, Hip or Shoulder *FRAX is a Materials engineer of the [employer] for Metabolic Bone Disease, a [place] (WHO) [facility]. ASSESSMENT: The probability of a major osteoporotic fracture is 10.4% within the next ten years. The probability of a hip fracture is 1.6% within the next ten years. . Electronically Signed   By: Ilona Sorrel M.D.   On: 07/16/2015 10:31   Mm Diag Breast Tomo Bilateral  07/16/2015  CLINICAL DATA:  Routine  postlumpectomy follow-up. The patient had a left breast lumpectomy in 2013. EXAM: DIGITAL DIAGNOSTIC BILATERAL MAMMOGRAM WITH 3D TOMOSYNTHESIS AND CAD COMPARISON:  Previous exam(s). ACR Breast Density Category b: There are scattered areas of fibroglandular density. FINDINGS: Stable left breast lumpectomy site. No suspicious calcifications, masses or areas of distortion are seen in the bilateral breasts. Mammographic images were processed with CAD. IMPRESSION: Stable left lumpectomy site. No mammographic evidence of malignancy in the bilateral breasts. RECOMMENDATION: Diagnostic mammogram is suggested in 1 year. (Code:DM-B-01Y) I have discussed the findings and recommendations with the patient. Results were also provided in writing at the conclusion of the visit. If applicable, a reminder letter will be sent to the patient regarding the next appointment. BI-RADS CATEGORY  2: Benign. Electronically Signed   By: Ammie Ferrier M.D.   On: 07/16/2015 09:42    ASSESSMENT: Carcinoma breast stage IC at present time there is no evidence of recurrent disease There is no clinical evidence of recurrent disease in the breast.  Mammogram of November 14 has been reported to be negative On density study has been reviewed and shows 10% chance of major fracture.  I started talking to the patient regarding either Boniva orPROLIA However patient would like to continue calcium and vitamin D and will be reevaluated in 4 months. Abscess on the face patient is getting amoxicillin Patient was strongly advised to get dental checkup done as there is a lot of inflammation of the gums patient may need total teeth  more  MEDICAL DECISION MAKING:  We will continue anastrozole calcium and vitamin Repeat  mammogram has been scheduled  clinical examination there is no evidence of recurrent disease  Patient expressed understanding and was in agreement with this plan. She also understands that She can call clinic at any time with  any questions, concerns, or complaints.    Cancer of  left breast   Staging form: Breast, AJCC 7th Edition     Clinical: Stage IA (T1c, N0, M0) - Signed by Forest Gleason, MD on 01/26/2015   Forest Gleason, MD   07/18/2015 11:16 AM

## 2015-07-18 NOTE — Progress Notes (Signed)
Patient requesting refill for anastrazole.

## 2015-07-24 ENCOUNTER — Encounter: Payer: Self-pay | Admitting: Oncology

## 2015-07-27 ENCOUNTER — Other Ambulatory Visit: Payer: Self-pay | Admitting: Oncology

## 2015-09-26 DIAGNOSIS — R55 Syncope and collapse: Secondary | ICD-10-CM | POA: Insufficient documentation

## 2016-01-15 ENCOUNTER — Inpatient Hospital Stay (HOSPITAL_BASED_OUTPATIENT_CLINIC_OR_DEPARTMENT_OTHER): Payer: Medicare Other | Admitting: Family Medicine

## 2016-01-15 ENCOUNTER — Inpatient Hospital Stay: Payer: Medicare Other | Attending: Oncology

## 2016-01-15 ENCOUNTER — Encounter: Payer: Self-pay | Admitting: Family Medicine

## 2016-01-15 VITALS — BP 136/71 | HR 64 | Temp 96.9°F | Wt 173.3 lb

## 2016-01-15 DIAGNOSIS — Z170 Estrogen receptor positive status [ER+]: Secondary | ICD-10-CM

## 2016-01-15 DIAGNOSIS — Z79811 Long term (current) use of aromatase inhibitors: Secondary | ICD-10-CM

## 2016-01-15 DIAGNOSIS — Z803 Family history of malignant neoplasm of breast: Secondary | ICD-10-CM | POA: Diagnosis not present

## 2016-01-15 DIAGNOSIS — C50912 Malignant neoplasm of unspecified site of left female breast: Secondary | ICD-10-CM | POA: Insufficient documentation

## 2016-01-15 DIAGNOSIS — Z79899 Other long term (current) drug therapy: Secondary | ICD-10-CM | POA: Diagnosis not present

## 2016-01-15 DIAGNOSIS — M8580 Other specified disorders of bone density and structure, unspecified site: Secondary | ICD-10-CM | POA: Diagnosis not present

## 2016-01-15 DIAGNOSIS — N6489 Other specified disorders of breast: Secondary | ICD-10-CM | POA: Diagnosis not present

## 2016-01-15 DIAGNOSIS — I10 Essential (primary) hypertension: Secondary | ICD-10-CM

## 2016-01-15 DIAGNOSIS — Z780 Asymptomatic menopausal state: Secondary | ICD-10-CM

## 2016-01-15 DIAGNOSIS — F419 Anxiety disorder, unspecified: Secondary | ICD-10-CM | POA: Insufficient documentation

## 2016-01-15 DIAGNOSIS — C50212 Malignant neoplasm of upper-inner quadrant of left female breast: Secondary | ICD-10-CM

## 2016-01-15 LAB — CBC WITH DIFFERENTIAL/PLATELET
Basophils Absolute: 0.1 10*3/uL (ref 0–0.1)
Basophils Relative: 1 %
EOS PCT: 4 %
Eosinophils Absolute: 0.3 10*3/uL (ref 0–0.7)
HEMATOCRIT: 39.5 % (ref 35.0–47.0)
Hemoglobin: 13.5 g/dL (ref 12.0–16.0)
LYMPHS ABS: 2 10*3/uL (ref 1.0–3.6)
LYMPHS PCT: 30 %
MCH: 29.7 pg (ref 26.0–34.0)
MCHC: 34.1 g/dL (ref 32.0–36.0)
MCV: 87 fL (ref 80.0–100.0)
MONO ABS: 0.5 10*3/uL (ref 0.2–0.9)
MONOS PCT: 8 %
NEUTROS ABS: 3.7 10*3/uL (ref 1.4–6.5)
Neutrophils Relative %: 57 %
Platelets: 280 10*3/uL (ref 150–440)
RBC: 4.54 MIL/uL (ref 3.80–5.20)
RDW: 12.4 % (ref 11.5–14.5)
WBC: 6.6 10*3/uL (ref 3.6–11.0)

## 2016-01-15 LAB — COMPREHENSIVE METABOLIC PANEL
ALT: 15 U/L (ref 14–54)
ANION GAP: 7 (ref 5–15)
AST: 20 U/L (ref 15–41)
Albumin: 4.5 g/dL (ref 3.5–5.0)
Alkaline Phosphatase: 64 U/L (ref 38–126)
BILIRUBIN TOTAL: 0.4 mg/dL (ref 0.3–1.2)
BUN: 13 mg/dL (ref 6–20)
CO2: 31 mmol/L (ref 22–32)
Calcium: 9.5 mg/dL (ref 8.9–10.3)
Chloride: 99 mmol/L — ABNORMAL LOW (ref 101–111)
Creatinine, Ser: 1.22 mg/dL — ABNORMAL HIGH (ref 0.44–1.00)
GFR, EST AFRICAN AMERICAN: 53 mL/min — AB (ref >60)
GFR, EST NON AFRICAN AMERICAN: 45 mL/min — AB (ref >60)
Glucose, Bld: 114 mg/dL — ABNORMAL HIGH (ref 65–99)
POTASSIUM: 3.7 mmol/L (ref 3.5–5.1)
Sodium: 137 mmol/L (ref 135–145)
TOTAL PROTEIN: 7.5 g/dL (ref 6.5–8.1)

## 2016-01-15 NOTE — Progress Notes (Signed)
[place]  Telephone:(336) [phone]  Fax:(336) [phone]     Hayley Newman DOB: 1951-01-22  MR#: [id number]  AOZ#:[id number]  Patient Care Team: No Pcp Per Patient as PCP - General (General Practice)  CHIEF COMPLAINT:  Chief Complaint  Patient presents with  . Breast Cancer    INTERVAL HISTORY:  Patient is here for further follow-up regarding carcinoma of the left breast. She status post lumpectomy in August 2013 and was started on Arimidex in October 2013. Her most recent mammogram was performed in November 2016 and was reported as BI-RADS 2, benign. Patient does have very pendulous and dense breast tissue but reports monthly self exams. Patient also reports recently being evaluated by a Dr. Ubaldo Glassing for cardiovascular issues. Overall she reports feeling very well and denies any other complaints.  REVIEW OF SYSTEMS:   Review of Systems  Constitutional: Negative for fever, chills, weight loss, malaise/fatigue and diaphoresis.  HENT: Negative.   Eyes: Negative.   Respiratory: Negative for cough, hemoptysis, sputum production, shortness of breath and wheezing.   Cardiovascular: Negative for chest pain, palpitations, orthopnea, claudication, leg swelling and PND.  Gastrointestinal: Negative for heartburn, nausea, vomiting, abdominal pain, diarrhea, constipation, blood in stool and melena.  Genitourinary: Negative.   Musculoskeletal: Negative.   Skin: Negative.   Neurological: Negative for dizziness, tingling, focal weakness, seizures and weakness.  Endo/Heme/Allergies: Does not bruise/bleed easily.  Psychiatric/Behavioral: Negative for depression. The patient is not nervous/anxious and does not have insomnia.     As per HPI. Otherwise, a complete review of systems is negatve.  ONCOLOGY HISTORY: Oncology History   1. Carcinoma of left breast stage Ic T1 C. N0 M0 tumor  Estrogen receptor +2  Progesterone receptor positive  HER-2/neu receptor negative by  Fish patient has been ruled out to do mastectomy or radiation therapy STATUS POST LUMPECTOMY IN August of 2013 2. Oncotype score is low 3. Patient has been started on Arimidex from October, 2013     Cancer of left breast [facility])   04/27/2012 Initial Diagnosis Cancer of left breast    PAST MEDICAL HISTORY: Past Medical History  Diagnosis Date  . Breast cancer ([place])   . Renal insufficiency   . Anxiety   . Hypertension   . Cancer of left breast ([place]) 01/26/2015    PAST SURGICAL HISTORY: Past Surgical History  Procedure Laterality Date  . Breast lumpectomy Left   . Dilation and curettage of uterus    . Biopsy breast Left   . Breast biopsy Left 2013    FAMILY HISTORY Family History  Problem Relation Age of Onset  . Breast cancer Maternal Aunt   . Breast cancer Maternal Grandmother     GYNECOLOGIC HISTORY:  No LMP recorded. Patient is postmenopausal.     ADVANCED DIRECTIVES:    HEALTH MAINTENANCE: Social History  Substance Use Topics  . Smoking status: Never Smoker   . Smokeless tobacco: None  . Alcohol Use: None     Colonoscopy:  PAP:  Bone density:November 2016  Mammogram:November 2016  Allergies  Allergen Reactions  . Tramadol Hcl Other (See Comments)  . Ibuprofen Other (See Comments)    Due to kidneys  . Acetaminophen Other (See Comments)  . Diphenhydramine-Acetaminophen Other (See Comments)  . Levaquin [Levofloxacin] Rash  . Tape Rash    Current Outpatient Prescriptions  Medication Sig Dispense Refill  . anastrozole (ARIMIDEX) 1 MG tablet TAKE 1 TABLET DAILY 90 tablet 3  . atenolol (TENORMIN) 25 MG tablet Take by mouth.    Marland Kitchen  calcium carbonate (OS-CAL) 600 MG TABS tablet Take 600 mg by mouth 2 (two) times daily with a meal.    . Multiple Vitamin (MULTI-VITAMINS) TABS Take by mouth.    . Multiple Vitamins-Minerals (CENTRUM SILVER PO) Take 1 capsule by mouth.    . nystatin (MYCOSTATIN) powder Apply topically.    . traZODone (DESYREL) 50 MG tablet  Take by mouth.    . triamterene-hydrochlorothiazide (DYAZIDE) 37.5-25 MG per capsule Take 1 capsule by mouth daily.    Marland Kitchen triamterene-hydrochlorothiazide (MAXZIDE-25) 37.5-25 MG per tablet Take by mouth.     No current facility-administered medications for this visit.    OBJECTIVE: BP 136/71 mmHg  Pulse 64  Temp(Src) 96.9 F (36.1 C) (Tympanic)  Wt 173 lb 4.5 oz (78.6 kg)   Body mass index is 27.13 kg/(m^2).    ECOG FS:0 - Asymptomatic  General: Well-developed, well-nourished, no acute distress. Eyes: Pink conjunctiva, anicteric sclera. HEENT: Normocephalic, moist mucous membranes, clear oropharnyx. Lungs: Clear to auscultation bilaterally. Heart: Regular rate and rhythm. No rubs, murmurs, or gallops. Abdomen: Soft, nontender, nondistended. No organomegaly noted, normoactive bowel sounds. Breast: Breast palpated in a circular manner in the sitting and supine positions.  No masses or fullness palpated.  Axilla palpated in both positions with no masses or fullness palpated.  Musculoskeletal: No edema, cyanosis, or clubbing. Neuro: Alert, answering all questions appropriately. Cranial nerves grossly intact. Skin: No rashes or petechiae noted. Psych: Normal affect. Lymphatics: No cervical, clavicular, or axillary LAD.   LAB RESULTS:  Appointment on 01/15/2016  Component Date Value Ref Range Status  . WBC 01/15/2016 6.6  3.6 - 11.0 K/uL Final  . RBC 01/15/2016 4.54  3.80 - 5.20 MIL/uL Final  . Hemoglobin 01/15/2016 13.5  12.0 - 16.0 g/dL Final  . HCT 01/15/2016 39.5  35.0 - 47.0 % Final  . MCV 01/15/2016 87.0  80.0 - 100.0 fL Final  . MCH 01/15/2016 29.7  26.0 - 34.0 pg Final  . MCHC 01/15/2016 34.1  32.0 - 36.0 g/dL Final  . RDW 01/15/2016 12.4  11.5 - 14.5 % Final  . Platelets 01/15/2016 280  150 - 440 K/uL Final  . Neutrophils Relative % 01/15/2016 57   Final  . Neutro Abs 01/15/2016 3.7  1.4 - 6.5 K/uL Final  . Lymphocytes Relative 01/15/2016 30   Final  . Lymphs Abs  01/15/2016 2.0  1.0 - 3.6 K/uL Final  . Monocytes Relative 01/15/2016 8   Final  . Monocytes Absolute 01/15/2016 0.5  0.2 - 0.9 K/uL Final  . Eosinophils Relative 01/15/2016 4   Final  . Eosinophils Absolute 01/15/2016 0.3  0 - 0.7 K/uL Final  . Basophils Relative 01/15/2016 1   Final  . Basophils Absolute 01/15/2016 0.1  0 - 0.1 K/uL Final  . Sodium 01/15/2016 137  135 - 145 mmol/L Final  . Potassium 01/15/2016 3.7  3.5 - 5.1 mmol/L Final  . Chloride 01/15/2016 99* 101 - 111 mmol/L Final  . CO2 01/15/2016 31  22 - 32 mmol/L Final  . Glucose, Bld 01/15/2016 114* 65 - 99 mg/dL Final  . BUN 01/15/2016 13  6 - 20 mg/dL Final  . Creatinine, Ser 01/15/2016 1.22* 0.44 - 1.00 mg/dL Final  . Calcium 01/15/2016 9.5  8.9 - 10.3 mg/dL Final  . Total Protein 01/15/2016 7.5  6.5 - 8.1 g/dL Final  . Albumin 01/15/2016 4.5  3.5 - 5.0 g/dL Final  . AST 01/15/2016 20  15 - 41 U/L Final  . ALT 01/15/2016  15  14 - 54 U/L Final  . Alkaline Phosphatase 01/15/2016 64  38 - 126 U/L Final  . Total Bilirubin 01/15/2016 0.4  0.3 - 1.2 mg/dL Final  . GFR calc non Af Amer 01/15/2016 45* >60 mL/min Final  . GFR calc Af Amer 01/15/2016 53* >60 mL/min Final   Comment: (NOTE) The eGFR has been calculated using the CKD EPI equation. This calculation has not been validated in all clinical situations. eGFR's persistently <60 mL/min signify possible Chronic Kidney Disease.   . Anion gap 01/15/2016 7  5 - 15 Final    STUDIES: No results found.  ASSESSMENT:  Carcinoma of left breast, stage I C, T1 cN0 M0, ER PR positive HER-2/neu negative.  PLAN:   1. Carcinoma of breast. Patient is status post lumpectomy from August 2013. She was then started on Arimidex, vitamin D, and calcium in October 2013 and has been tolerating very well. Clinically there is no evidence of recurrent disease. Her most recent mammogram was performed in November 2016 and reported as BI-RADS 2, benign. Patient also underwent a DEXA scan at that  time which revealed osteopenia. Patient continues to prefer calcium and vitamin D supplementation. We will continue with routine follow-up in approximately 6 months and we'll proceed with scheduling next mammogram.  Patient expressed understanding and was in agreement with this plan. She also understands that She can call clinic at any time with any questions, concerns, or complaints.   Dr. Grayland Ormond was available for consultation and review of plan of care for this patient.  Cancer of left breast [facility])   Staging form: Breast, AJCC 7th Edition     Clinical: Stage IA (T1c, N0, M0) - Signed by Forest Gleason, MD on 01/26/2015   Evlyn Kanner, NP   01/15/2016 10:09 AM

## 2016-07-18 ENCOUNTER — Ambulatory Visit: Payer: Medicare Other

## 2016-07-18 ENCOUNTER — Other Ambulatory Visit: Payer: Medicare Other

## 2016-07-21 ENCOUNTER — Ambulatory Visit: Payer: Medicare Other

## 2016-07-21 ENCOUNTER — Other Ambulatory Visit: Payer: Medicare Other

## 2016-07-22 ENCOUNTER — Encounter: Payer: Self-pay | Admitting: General Surgery

## 2016-07-22 ENCOUNTER — Ambulatory Visit: Payer: Medicare Other | Admitting: Family Medicine

## 2016-07-22 ENCOUNTER — Inpatient Hospital Stay (HOSPITAL_BASED_OUTPATIENT_CLINIC_OR_DEPARTMENT_OTHER): Payer: Medicare Other | Admitting: Internal Medicine

## 2016-07-22 ENCOUNTER — Inpatient Hospital Stay: Payer: Medicare Other | Attending: Internal Medicine

## 2016-07-22 VITALS — BP 124/80 | HR 86 | Temp 97.5°F | Ht 63.5 in | Wt 171.2 lb

## 2016-07-22 DIAGNOSIS — M8580 Other specified disorders of bone density and structure, unspecified site: Secondary | ICD-10-CM | POA: Diagnosis not present

## 2016-07-22 DIAGNOSIS — K029 Dental caries, unspecified: Secondary | ICD-10-CM | POA: Diagnosis not present

## 2016-07-22 DIAGNOSIS — M549 Dorsalgia, unspecified: Secondary | ICD-10-CM | POA: Insufficient documentation

## 2016-07-22 DIAGNOSIS — Z79899 Other long term (current) drug therapy: Secondary | ICD-10-CM | POA: Diagnosis not present

## 2016-07-22 DIAGNOSIS — D6489 Other specified anemias: Secondary | ICD-10-CM | POA: Diagnosis not present

## 2016-07-22 DIAGNOSIS — C50912 Malignant neoplasm of unspecified site of left female breast: Secondary | ICD-10-CM | POA: Insufficient documentation

## 2016-07-22 DIAGNOSIS — E559 Vitamin D deficiency, unspecified: Secondary | ICD-10-CM | POA: Diagnosis not present

## 2016-07-22 DIAGNOSIS — C50212 Malignant neoplasm of upper-inner quadrant of left female breast: Secondary | ICD-10-CM

## 2016-07-22 DIAGNOSIS — I10 Essential (primary) hypertension: Secondary | ICD-10-CM | POA: Insufficient documentation

## 2016-07-22 DIAGNOSIS — N6489 Other specified disorders of breast: Secondary | ICD-10-CM | POA: Insufficient documentation

## 2016-07-22 DIAGNOSIS — Z79810 Long term (current) use of selective estrogen receptor modulators (SERMs): Secondary | ICD-10-CM

## 2016-07-22 DIAGNOSIS — Z170 Estrogen receptor positive status [ER+]: Secondary | ICD-10-CM | POA: Insufficient documentation

## 2016-07-22 DIAGNOSIS — K0510 Chronic gingivitis, plaque induced: Secondary | ICD-10-CM

## 2016-07-22 DIAGNOSIS — C50012 Malignant neoplasm of nipple and areola, left female breast: Secondary | ICD-10-CM

## 2016-07-22 DIAGNOSIS — Z780 Asymptomatic menopausal state: Secondary | ICD-10-CM | POA: Insufficient documentation

## 2016-07-22 LAB — COMPREHENSIVE METABOLIC PANEL
ALBUMIN: 4.8 g/dL (ref 3.5–5.0)
ALT: 11 U/L — ABNORMAL LOW (ref 14–54)
ANION GAP: 5 (ref 5–15)
AST: 15 U/L (ref 15–41)
Alkaline Phosphatase: 70 U/L (ref 38–126)
BUN: 22 mg/dL — AB (ref 6–20)
CHLORIDE: 99 mmol/L — AB (ref 101–111)
CO2: 34 mmol/L — ABNORMAL HIGH (ref 22–32)
Calcium: 9.6 mg/dL (ref 8.9–10.3)
Creatinine, Ser: 1.12 mg/dL — ABNORMAL HIGH (ref 0.44–1.00)
GFR calc Af Amer: 58 mL/min — ABNORMAL LOW (ref >60)
GFR calc non Af Amer: 50 mL/min — ABNORMAL LOW (ref >60)
GLUCOSE: 121 mg/dL — AB (ref 65–99)
POTASSIUM: 3.9 mmol/L (ref 3.5–5.1)
Sodium: 138 mmol/L (ref 135–145)
Total Bilirubin: 0.8 mg/dL (ref 0.3–1.2)
Total Protein: 7.4 g/dL (ref 6.5–8.1)

## 2016-07-22 LAB — CBC WITH DIFFERENTIAL/PLATELET
Basophils Absolute: 0 10*3/uL (ref 0–0.1)
Basophils Relative: 1 %
EOS ABS: 0.2 10*3/uL (ref 0–0.7)
Eosinophils Relative: 3 %
HEMATOCRIT: 39 % (ref 35.0–47.0)
HEMOGLOBIN: 13.4 g/dL (ref 12.0–16.0)
LYMPHS ABS: 2.1 10*3/uL (ref 1.0–3.6)
LYMPHS PCT: 33 %
MCH: 29.8 pg (ref 26.0–34.0)
MCHC: 34.2 g/dL (ref 32.0–36.0)
MCV: 87.1 fL (ref 80.0–100.0)
MONOS PCT: 8 %
Monocytes Absolute: 0.5 10*3/uL (ref 0.2–0.9)
NEUTROS ABS: 3.5 10*3/uL (ref 1.4–6.5)
NEUTROS PCT: 55 %
Platelets: 252 10*3/uL (ref 150–440)
RBC: 4.48 MIL/uL (ref 3.80–5.20)
RDW: 12.7 % (ref 11.5–14.5)
WBC: 6.3 10*3/uL (ref 3.6–11.0)

## 2016-07-22 MED ORDER — TAMOXIFEN CITRATE 20 MG PO TABS: 20.0000 mg | ORAL_TABLET | Freq: Every day | ORAL | 3 refills | Status: DC

## 2016-07-23 ENCOUNTER — Other Ambulatory Visit: Payer: Self-pay | Admitting: *Deleted

## 2016-07-23 LAB — VITAMIN D 25 HYDROXY (VIT D DEFICIENCY, FRACTURES): VIT D 25 HYDROXY: 37.1 ng/mL (ref 30.0–100.0)

## 2016-07-23 MED ORDER — TAMOXIFEN CITRATE 20 MG PO TABS: 20.0000 mg | ORAL_TABLET | Freq: Every day | ORAL | 3 refills | Status: AC

## 2016-07-28 ENCOUNTER — Telehealth: Payer: Self-pay | Admitting: *Deleted

## 2016-07-28 NOTE — Telephone Encounter (Signed)
Pt called asking for VIT D level results after blood work from last appt.  Pt informed of 37.1 Vit D level-WNL.

## 2016-07-29 ENCOUNTER — Other Ambulatory Visit: Payer: Self-pay | Admitting: Internal Medicine

## 2016-07-29 ENCOUNTER — Other Ambulatory Visit: Payer: Self-pay | Admitting: Family Medicine

## 2016-07-29 ENCOUNTER — Ambulatory Visit
Admission: RE | Admit: 2016-07-29 | Discharge: 2016-07-29 | Disposition: A | Discharge status: Home / Self Care | Payer: Medicare Other | Source: Ambulatory Visit

## 2016-07-29 ENCOUNTER — Ambulatory Visit
Admission: RE | Admit: 2016-07-29 | Discharge: 2016-07-29 | Disposition: A | Discharge status: Home / Self Care | Payer: Medicare Other | Source: Ambulatory Visit | Attending: Family Medicine | Admitting: Family Medicine

## 2016-07-29 DIAGNOSIS — C50212 Malignant neoplasm of upper-inner quadrant of left female breast: Secondary | ICD-10-CM

## 2016-07-29 DIAGNOSIS — Z853 Personal history of malignant neoplasm of breast: Secondary | ICD-10-CM | POA: Diagnosis present

## 2016-07-31 ENCOUNTER — Ambulatory Visit (INDEPENDENT_AMBULATORY_CARE_PROVIDER_SITE_OTHER): Payer: Medicare Other | Admitting: General Surgery

## 2016-07-31 ENCOUNTER — Encounter: Payer: Self-pay | Admitting: General Surgery

## 2016-07-31 VITALS — BP 134/74 | HR 76 | Resp 14 | Ht 63.0 in | Wt 172.0 lb

## 2016-07-31 DIAGNOSIS — C50212 Malignant neoplasm of upper-inner quadrant of left female breast: Secondary | ICD-10-CM

## 2016-07-31 DIAGNOSIS — Z170 Estrogen receptor positive status [ER+]: Secondary | ICD-10-CM

## 2016-07-31 NOTE — Patient Instructions (Signed)
Will make appointment with plastic surgeon to further discuss her options

## 2016-07-31 NOTE — Progress Notes (Signed)
Patient ID: Hayley Newman, female   DOB: September 30, 1950, 65 y.o.   MRN: [id number]  Chief Complaint  Patient presents with  . Breast Problem    cancer left breast    HPI Hayley Newman is a 65 y.o. female here today for a evaluation and discussion of possible breast surgery. She has a known history of left breast cancer. Last mammogram was 07/29/16. She was taking Arimidex beginning in Oct 2013, but reportedly was changed to Tamoxifen last week. She complains of significant back pain which she attributes to her large breasts. She is contemplating bilateral breast reduction vs. mastectomy to relieve some back pain, as well as a preventative measure given her personal and family history of breast cancer. She is being followed by oncology. I have reviewed the history of present illness with the patient.  HPI  Past Medical History:  Diagnosis Date  . Anxiety   . Breast cancer ([place])   . Cancer of left breast ([place]) 01/26/2012  . Hypertension   . Renal insufficiency     Past Surgical History:  Procedure Laterality Date  . BIOPSY BREAST Left   . BREAST BIOPSY Left 2013   Dr Pat Patrick  . BREAST LUMPECTOMY Left    Dr Pat Patrick  . DILATION AND CURETTAGE OF UTERUS      Family History  Problem Relation Age of Onset  . Breast cancer Maternal Aunt   . Breast cancer Maternal Grandmother     Social History Social History  Substance Use Topics  . Smoking status: Never Smoker  . Smokeless tobacco: Never Used  . Alcohol use Yes     Comment: rare    Allergies  Allergen Reactions  . Tramadol Hcl Other (See Comments)  . Ibuprofen Other (See Comments)    Due to kidneys  . Acetaminophen Other (See Comments)    She states she can take tylenol  . Diphenhydramine-Acetaminophen Other (See Comments)  . Levaquin [Levofloxacin] Rash  . Tape Rash    Current Outpatient Prescriptions  Medication Sig Dispense Refill  . calcium carbonate (OS-CAL) 600 MG TABS tablet Take 600 mg by mouth 2 (two)  times Newman with a meal.    . diclofenac sodium (VOLTAREN) 1 % GEL Apply topically.    Marland Kitchen losartan-hydrochlorothiazide (HYZAAR) 100-12.5 MG tablet Take by mouth.    . Multiple Vitamins-Minerals (CENTRUM SILVER PO) Take 1 capsule by mouth.    . tamoxifen (NOLVADEX) 20 MG tablet Take 1 tablet (20 mg total) by mouth Newman. 90 tablet 3  . traZODone (DESYREL) 50 MG tablet Take by mouth.     No current facility-administered medications for this visit.     Review of Systems Review of Systems  Constitutional: Negative.   Respiratory: Negative.   Cardiovascular: Negative.     Blood pressure 134/74, pulse 76, resp. rate 14, height 5' 3"  (1.6 m), weight 172 lb (78 kg).  Physical Exam Physical Exam  Constitutional: She is oriented to person, place, and time. She appears well-developed and well-nourished.  Eyes: Conjunctivae are normal. No scleral icterus.  Neck: Neck supple.  Cardiovascular: Normal rate, regular rhythm and normal heart sounds.   Pulmonary/Chest: Effort normal and breath sounds normal. Right breast exhibits no inverted nipple, no mass, no nipple discharge, no skin change and no tenderness. Left breast exhibits no inverted nipple, no mass, no nipple discharge, no skin change and no tenderness.  Very large pendulous breasts. Left breast lumpectomy incision well healed. No signs of local recurrence.  Abdominal: Soft. There  is no tenderness.  Lymphadenopathy:    She has no cervical adenopathy.    She has no axillary adenopathy.  Neurological: She is alert and oriented to person, place, and time.  Skin: Skin is warm and dry.    Data Reviewed Previous notes, mammogram, and labs  Assessment    Hx of left breast cancer, Stage 1c. ER/PR positive, HER-2 negative, s/p lumpectomy in 2013. Did not have radiation. Currently on Tamoxifen per patient, previously on Arimidex beginning in Oct 2013    Plan    Had a detailed discussion regarding the patient's concerns. Varying options -  bilateral breast reduction, bilateral mastectomy with or without reconstruction were fully discussed. Patient is keen on having something done. Recommend consultation from plastic surgery and will also discuss with oncologist. Patient is agreeable with this plan. Referral made to plastic surgery.   This information has been scribed by Karie Fetch RN, BSN,BC.    07/31/2016, 11:13 AM

## 2016-07-31 NOTE — Progress Notes (Signed)
[place] @ [facility] [phone]) [phone]  Fax:(336) [place]: 65-06-1951  MR#: [id number]  MPN#:[id number]  Patient Care Team: No Pcp Per Patient as PCP - General (General Practice)  CHIEF COMPLAINT:  Chief Complaint  . F/u for left breast cancer    Oncology History   1. Carcinoma of left breast stage Ic T1 C. N0 M0 tumor  Estrogen receptor +2  Progesterone receptor positive  HER-2/neu receptor negative by [place] patient has been ruled out to do mastectomy or radiation therapy STATUS POST LUMPECTOMY IN August of 2013 2. Oncotype score is low 3. 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  PAST MEDICAL HISTORY: Past Medical History  Diagnosis Date  . Breast cancer ([place])   . Renal insufficiency   . Anxiety   . Hypertension   . Cancer of left breast ([place]) 01/26/2015    PAST SURGICAL HISTORY: Past Surgical History  Procedure Laterality Date  . Breast lumpectomy Left   . Dilation and curettage of uterus    . Biopsy breast Left   . Breast biopsy Left 2013    FAMILY HISTORY Significant History/PMH:   Kidney Insuffiency:    Cancer Left Breast:    Anxiety:    Hypertension:    Left Lumpectomy: Aug 2013   D&C X 2:    Breast biopsy: 30-Mar-2012  Preventive Screening:  Has patient had any of the following test? Colonscopy  Mammography (1)   Last Colonoscopy: 2009(1)   Last Mammography: Oct 2014(1)   Smoking History: Smoking History Never Smoked.(1)  PFSH: Family History: noncontributory  Social History: negative alcohol, negative tobacco  Additional Past Medical and Surgical History: As mentioned above    GYNECOLOGIC HISTORY:  No LMP recorded. Patient is postmenopausal.     ADVANCED DIRECTIVES:    HEALTH MAINTENANCE: Social History  Substance Use Topics  . Smoking status: Never Smoker   . Smokeless tobacco: None  . Alcohol Use: None      Allergies  Allergen Reactions  . Tramadol Hcl Other (See Comments)  . Ibuprofen Other (See Comments)    Due to kidneys  . Acetaminophen Other (See Comments)  . Levaquin [Levofloxacin] Rash  . Tape Rash    Current Outpatient Prescriptions  Medication Sig Dispense Refill  . amoxicillin (AMOXIL) 500 MG tablet Take 1 tablet (500 mg total) by mouth 3 (three) times daily. 30 tablet 0  . anastrozole (ARIMIDEX) 1 MG tablet Take 1 mg by mouth daily.    Marland Kitchen atenolol (TENORMIN) 50 MG tablet Take 50 mg by mouth daily.    . calcium carbonate (OS-CAL) 600 MG TABS tablet Take 600 mg by mouth 2 (two) times daily with a meal.    . Melatonin 1 MG CAPS Take 1 capsule by mouth.    . Multiple Vitamins-Minerals (CENTRUM SILVER PO) Take 1 capsule by mouth.    . nystatin (MYCOSTATIN) powder Apply topically.    . triamterene-hydrochlorothiazide (DYAZIDE) 37.5-25 MG per capsule Take 1 capsule by mouth daily.    Marland Kitchen triamterene-hydrochlorothiazide (MAXZIDE-25) 37.5-25 MG per tablet Take by mouth.     No current facility-administered medications for this visit.  OBJECTIVE:  Filed Vitals:   07/18/15 1055  BP: 151/71  Pulse: 64  Temp: 95.9 F (35.5 C)     Body mass index is 28.03 kg/(m^2).    ECOG FS:1 - Symptomatic but completely ambulatory  Physical Exam  Constitutional: She is oriented to person, place, and time. She appears well-developed.  Ill kempt  HENT:  Head: Normocephalic and atraumatic.  inflammed gums and several cariosu teeth noted  Eyes: Pupils are equal, round, and reactive to light.  Neck: Normal range of motion. Neck supple. No tracheal deviation present.  Cardiovascular: Normal rate and regular rhythm.   Pulmonary/Chest: Effort  normal.  Abdominal: Soft. She exhibits no mass.  Musculoskeletal: She exhibits deformity. She exhibits no edema.  Kyphotic thoracic spine, stooped posture  Lymphadenopathy:    She has no cervical adenopathy.  Neurological: She is alert and oriented to person, place, and time.  Skin: No erythema. No pallor.  Psychiatric: She has a normal mood and affect. Her behavior is normal. Judgment and thought content normal.   Breast exam: Large pendulous breasts were noted upto mid abdomen. Right breast shows no palpable lumps, no concerning skin or nipple irregularites. Left breast shows no palpable lumps, no concerning skin or nipple irregularites. Well healed lumpectomy scar and induration noted in the left breast, but there an area of superficial ulceration with no discharge over the left breast, with atrophic skin.  Right axilla is free of any palpable lumps Left axillla is free of any palpable lumps  No adenopathy was felt in the supraclavicular or cervical regions  Upper extremities are without edema.  LAB RESULTS:  Appointment on 07/18/2015  Component Date Value Ref Range Status  . WBC 07/18/2015 5.7  3.6 - 11.0 K/uL Final  . RBC 07/18/2015 4.65  3.80 - 5.20 MIL/uL Final  . Hemoglobin 07/18/2015 13.6  12.0 - 16.0 g/dL Final  . HCT 07/18/2015 41.1  35.0 - 47.0 % Final  . MCV 07/18/2015 88.4  80.0 - 100.0 fL Final  . MCH 07/18/2015 29.2  26.0 - 34.0 pg Final  . MCHC 07/18/2015 33.0  32.0 - 36.0 g/dL Final  . RDW 07/18/2015 12.3  11.5 - 14.5 % Final  . Platelets 07/18/2015 243  150 - 440 K/uL Final  . Neutrophils Relative % 07/18/2015 58   Final  . Neutro Abs 07/18/2015 3.3  1.4 - 6.5 K/uL Final  . Lymphocytes Relative 07/18/2015 30   Final  . Lymphs Abs 07/18/2015 1.7  1.0 - 3.6 K/uL Final  . Monocytes Relative 07/18/2015 8   Final  . Monocytes Absolute 07/18/2015 0.4  0.2 - 0.9 K/uL Final  . Eosinophils Relative 07/18/2015 3   Final  . Eosinophils Absolute 07/18/2015 0.2  0 -  0.7 K/uL Final  . Basophils Relative 07/18/2015 1   Final  . Basophils Absolute 07/18/2015 0.0  0 - 0.1 K/uL Final  . Sodium 07/18/2015 136  135 - 145 mmol/L Final  . Potassium 07/18/2015 4.1  3.5 - 5.1 mmol/L Final  . Chloride 07/18/2015 99* 101 - 111 mmol/L Final  . CO2 07/18/2015 32  22 - 32 mmol/L Final  . Glucose, Bld 07/18/2015 134* 65 - 99 mg/dL Final  . BUN 07/18/2015 21* 6 - 20 mg/dL Final  . Creatinine, Ser 07/18/2015 1.15* 0.44 - 1.00 mg/dL Final  . Calcium 07/18/2015 9.2  8.9 - 10.3 mg/dL Final  . Total Protein 07/18/2015 7.3  6.5 - 8.1 g/dL Final  . Albumin 07/18/2015 4.1  3.5 -  5.0 g/dL Final  . AST 07/18/2015 16  15 - 41 U/L Final  . ALT 07/18/2015 13* 14 - 54 U/L Final  . Alkaline Phosphatase 07/18/2015 66  38 - 126 U/L Final  . Total Bilirubin 07/18/2015 0.7  0.3 - 1.2 mg/dL Final  . GFR calc non Af Amer 07/18/2015 49* >60 mL/min Final  . GFR calc Af Amer 07/18/2015 57* >60 mL/min Final   Comment: (NOTE) The eGFR has been calculated using the CKD EPI equation. This calculation has not been validated in all clinical situations. eGFR's persistently <60 mL/min signify possible Chronic Kidney Disease.   . Anion gap 07/18/2015 5  5 - 15 Final    Lab Results  Component Value Date   LABCA2 17.3 07/19/2014     STUDIES: Dg Bone Density  07/16/2015  EXAM: DUAL X-RAY ABSORPTIOMETRY (DXA) FOR BONE MINERAL DENSITY IMPRESSION: Dear Dr. Oliva Bustard, Your patient Hayley Newman completed a BMD test on 07/16/2015 using the [place] (analysis version: 14.10) manufactured by [employer]. The following summarizes the results of our evaluation. PATIENT BIOGRAPHICAL: Name: Tabbatha, Bordelon Patient ID: [id number] Birth Date: [birth year] Height: 62.0 in. Gender: Female Exam Date: 07/16/2015 Weight: 175.0 lbs. Indications: Breast CA, Height Loss, High Risk Meds, Postmenopausal Fractures: Treatments: CALCIUM VIT D, letrozole ASSESSMENT: The BMD measured at Forearm Radius 33%  is 0.667 g/cm2 with a T-score of -2.4. This patient is considered osteopenic according to [facility]) criteria. Lumbar spine was not utilized due to scoliosis. Site Region Measured Measured WHO Young Adult BMD Date       Age      Classification T-score DualFemur Neck Left 07/16/2015 64.6 Osteopenia -2.1 0.752 g/cm2 Left Forearm Radius 33% 07/16/2015 64.6 Osteopenia -2.4 0.667 g/cm2 World Health Organization [facility]) criteria for post-menopausal, Caucasian Women: Normal:       T-score at or above -1 SD Osteopenia:   T-score between -1 and -2.5 SD Osteoporosis: T-score at or below -2.5 SD RECOMMENDATIONS: [place] recommends that FDA-approved medical therapies be considered in postmenopausal women and men age 85 or older with a: 1. Hip or vertebral (clinical or morphometric) fracture. 2. T-score of < -2.5 at the spine or hip. 3. Ten-year fracture probability by FRAX of 3% or greater for hip fracture or 20% or greater for major osteoporotic fracture. All treatment decisions require clinical judgment and consideration of individual patient factors, including patient preferences, co-morbidities, previous drug use, risk factors not captured in the FRAX model (e.g. falls, vitamin D deficiency, increased bone turnover, interval significant decline in bone density) and possible under - or over-estimation of fracture risk by FRAX. All patients should ensure an adequate intake of dietary calcium (1200 mg/d) and vitamin D (800 IU daily) unless contraindicated. FOLLOW-UP: People with diagnosed cases of osteoporosis or at high risk for fracture should have regular bone mineral density tests. For patients eligible for Medicare, routine testing is allowed once every 2 years. The testing frequency can be increased to one year for patients who have rapidly progressing disease, those who are receiving or discontinuing medical therapy to restore bone mass, or have additional risk factors. I have  reviewed this report, and agree with the above findings. [facility] Radiology Dear Dr. Oliva Bustard, Your patient LORILEE CAFARELLA completed a FRAX assessment on 07/16/2015 using the [place] (analysis version: 14.10) manufactured by [employer]. The following summarizes the results of our evaluation. PATIENT BIOGRAPHICAL: Name: Talise, Sligh Patient ID: [id number] Birth Date: 1951-06-11  Height:    62.0 in. Gender:     [age]    [age]       Weight:    175.0 lbs. Ethnicity:  White                            Exam Date: 07/16/2015 FRAX* RESULTS:  (version: 3.5) 10-year Probability of Fracture1 Major Osteoporotic Fracture2 Hip Fracture 10.4% 1.6% Population: [place] (Caucasian) Risk Factors: None Based on Femur (Left) Neck BMD 1 -The 10-year probability of fracture may be lower than reported if the patient has received treatment. 2 -Major Osteoporotic Fracture: Clinical Spine, Forearm, Hip or Shoulder *FRAX is a Materials engineer of the [employer] for Metabolic Bone Disease, a [place] (WHO) [facility]. ASSESSMENT: The probability of a major osteoporotic fracture is 10.4% within the next ten years. The probability of a hip fracture is 1.6% within the next ten years. . Electronically Signed   By: Ilona Sorrel M.D.   On: 07/16/2015 10:31   Mm Diag Breast Tomo Bilateral  07/16/2015  CLINICAL DATA:  Routine postlumpectomy follow-up. The patient had a left breast lumpectomy in 2013. EXAM: DIGITAL DIAGNOSTIC BILATERAL MAMMOGRAM WITH 3D TOMOSYNTHESIS AND CAD COMPARISON:  Previous exam(s). ACR Breast Density Category b: There are scattered areas of fibroglandular density. FINDINGS: Stable left breast lumpectomy site. No suspicious calcifications, masses or areas of distortion are seen in the bilateral breasts. Mammographic images were processed with CAD. IMPRESSION: Stable left lumpectomy site. No mammographic evidence of malignancy in the  bilateral breasts. RECOMMENDATION: Diagnostic mammogram is suggested in 1 year. (Code:DM-B-01Y) I have discussed the findings and recommendations with the patient. Results were also provided in writing at the conclusion of the visit. If applicable, a reminder letter will be sent to the patient regarding the next appointment. BI-RADS CATEGORY  2: Benign. Electronically Signed   By: Ammie Ferrier M.D.   On: 07/16/2015 09:42    ASSESSMENT: Carcinoma left breast stage IC at present time there is no evidence of recurrent disease There is no clinical evidence of recurrent disease  Her last mammogram of November 2016 has been reported to be negative, she is due for her next annual mammogram this week, I will order one ASAP  Osteopenia was noted on a Dexa study from 07/2015, she was recommended injection Prolia or Boneva, at her visit with Dr. Oliva Bustard in 2015, However patient opted to continue calcium and vitamin D  Today I once again brought this up and explained to her that Prolia or Carolan Shiver will help improve her bone stength , but she is still in need of treatment for dental caries and gingivitis before we can start any. I will check her Vitamin D level and correct any deficiency meanwhile.  As the next best alternative, I offered her switch to Tamoxifen instead of Arimidex, that will help improve the bone strength. I did explaien to her the risk of uterine cancer and Dvt, she should report any vaginal bleeding or leg swelling immediateyl  With regard to her very heavy pendulous breasts she is already exhibiting pain in the thoracic spine and pain both axillae due to downward stretch form the heavy breasts, she is also at risk for osteoporotic fraure due to the sheer weight of her breasts.  She will complete 5 yrs of adjuvant hormonal therapy as of August 2018 Additionally, the skin over her breasts appears  to be ulcerating due to  stretch  I recommended that she discuss a bilateral mastecttomy  with her surgeon. She wanted to see a new surgeon, a referral has been made.   Orders Placed This Encounter  Procedures  . Vitamin D 25 hydroxy  . Ambulatory referral to General Surgery

## 2016-10-17 ENCOUNTER — Other Ambulatory Visit: Payer: Self-pay | Admitting: *Deleted

## 2016-10-17 DIAGNOSIS — Z853 Personal history of malignant neoplasm of breast: Secondary | ICD-10-CM

## 2016-10-19 NOTE — Progress Notes (Deleted)
[facility] day:  10/19/2016  Chief Complaint: Hayley Newman is a 66 y.o. female with X who is seen for reassessment.  HPI: ***  Past Medical History:  Diagnosis Date  . Anxiety   . Breast cancer ([place])   . Cancer of left breast ([place]) 01/26/2012  . Hypertension   . Renal insufficiency     Past Surgical History:  Procedure Laterality Date  . BIOPSY BREAST Left   . BREAST BIOPSY Left 2013   Dr Pat Patrick  . BREAST LUMPECTOMY Left    Dr Pat Patrick  . DILATION AND CURETTAGE OF UTERUS      Family History  Problem Relation Age of Onset  . Breast cancer Maternal Aunt   . Breast cancer Maternal Grandmother     Social History:  reports that she has never smoked. She has never used smokeless tobacco. She reports that she drinks alcohol. She reports that she does not use drugs.  The patient is accompanied by *** alone today.  Allergies:  Allergies  Allergen Reactions  . Tramadol Hcl Other (See Comments)  . Ibuprofen Other (See Comments)    Due to kidneys  . Acetaminophen Other (See Comments)    She states she can take tylenol  . Diphenhydramine-Acetaminophen Other (See Comments)  . Levaquin [Levofloxacin] Rash  . Tape Rash    Current Medications: Current Outpatient Prescriptions  Medication Sig Dispense Refill  . calcium carbonate (OS-CAL) 600 MG TABS tablet Take 600 mg by mouth 2 (two) times daily with a meal.    . diclofenac sodium (VOLTAREN) 1 % GEL Apply topically.    Marland Kitchen losartan-hydrochlorothiazide (HYZAAR) 100-12.5 MG tablet Take by mouth.    . Multiple Vitamins-Minerals (CENTRUM SILVER PO) Take 1 capsule by mouth.    . tamoxifen (NOLVADEX) 20 MG tablet Take 1 tablet (20 mg total) by mouth daily. 90 tablet 3  . traZODone (DESYREL) 50 MG tablet Take by mouth.     No current facility-administered medications for this visit.     Review of Systems:  GENERAL:  Feels good.  Active.  No fevers, sweats or weight loss. PERFORMANCE  STATUS (ECOG):  *** HEENT:  No visual changes, runny nose, sore throat, mouth sores or tenderness. Lungs: No shortness of breath or cough.  No hemoptysis. Cardiac:  No chest pain, palpitations, orthopnea, or PND. GI:  No nausea, vomiting, diarrhea, constipation, melena or hematochezia. GU:  No urgency, frequency, dysuria, or hematuria. Musculoskeletal:  No back pain.  No joint pain.  No muscle tenderness. Extremities:  No pain or swelling. Skin:  No rashes or skin changes. Neuro:  No headache, numbness or weakness, balance or coordination issues. Endocrine:  No diabetes, thyroid issues, hot flashes or night sweats. Psych:  No mood changes, depression or anxiety. Pain:  No focal pain. Review of systems:  All other systems reviewed and found to be negative.   Physical Exam: There were no vitals taken for this visit. GENERAL:  Well developed, well nourished, sitting comfortably in the exam room in no acute distress. MENTAL STATUS:  Alert and oriented to person, place and time. HEAD:  *** hair.  Normocephalic, atraumatic, face symmetric, no Cushingoid features. EYES:  *** eyes.  Pupils equal round and reactive to light and accomodation.  No conjunctivitis or scleral icterus. ENT:  Oropharynx clear without lesion.  Tongue normal. Mucous membranes moist.  RESPIRATORY:  Clear to auscultation without rales, wheezes or rhonchi. CARDIOVASCULAR:  Regular rate and rhythm without  murmur, rub or gallop. ABDOMEN:  Soft, non-tender, with active bowel sounds, and no hepatosplenomegaly.  No masses. SKIN:  No rashes, ulcers or lesions. EXTREMITIES: No edema, no skin discoloration or tenderness.  No palpable cords. LYMPH NODES: No palpable cervical, supraclavicular, axillary or inguinal adenopathy  NEUROLOGICAL: Unremarkable. PSYCH:  Appropriate.  No visits with results within 3 Day(s) from this visit.  Latest known visit with results is:  Clinical Support on 07/22/2016  Component Date Value Ref Range  Status  . WBC 07/22/2016 6.3  3.6 - 11.0 K/uL Final  . RBC 07/22/2016 4.48  3.80 - 5.20 MIL/uL Final  . Hemoglobin 07/22/2016 13.4  12.0 - 16.0 g/dL Final  . HCT 07/22/2016 39.0  35.0 - 47.0 % Final  . MCV 07/22/2016 87.1  80.0 - 100.0 fL Final  . MCH 07/22/2016 29.8  26.0 - 34.0 pg Final  . MCHC 07/22/2016 34.2  32.0 - 36.0 g/dL Final  . RDW 07/22/2016 12.7  11.5 - 14.5 % Final  . Platelets 07/22/2016 252  150 - 440 K/uL Final  . Neutrophils Relative % 07/22/2016 55  % Final  . Neutro Abs 07/22/2016 3.5  1.4 - 6.5 K/uL Final  . Lymphocytes Relative 07/22/2016 33  % Final  . Lymphs Abs 07/22/2016 2.1  1.0 - 3.6 K/uL Final  . Monocytes Relative 07/22/2016 8  % Final  . Monocytes Absolute 07/22/2016 0.5  0.2 - 0.9 K/uL Final  . Eosinophils Relative 07/22/2016 3  % Final  . Eosinophils Absolute 07/22/2016 0.2  0 - 0.7 K/uL Final  . Basophils Relative 07/22/2016 1  % Final  . Basophils Absolute 07/22/2016 0.0  0 - 0.1 K/uL Final  . Sodium 07/22/2016 138  135 - 145 mmol/L Final  . Potassium 07/22/2016 3.9  3.5 - 5.1 mmol/L Final  . Chloride 07/22/2016 99* 101 - 111 mmol/L Final  . CO2 07/22/2016 34* 22 - 32 mmol/L Final  . Glucose, Bld 07/22/2016 121* 65 - 99 mg/dL Final  . BUN 07/22/2016 22* 6 - 20 mg/dL Final  . Creatinine, Ser 07/22/2016 1.12* 0.44 - 1.00 mg/dL Final  . Calcium 07/22/2016 9.6  8.9 - 10.3 mg/dL Final  . Total Protein 07/22/2016 7.4  6.5 - 8.1 g/dL Final  . Albumin 07/22/2016 4.8  3.5 - 5.0 g/dL Final  . AST 07/22/2016 15  15 - 41 U/L Final  . ALT 07/22/2016 11* 14 - 54 U/L Final  . Alkaline Phosphatase 07/22/2016 70  38 - 126 U/L Final  . Total Bilirubin 07/22/2016 0.8  0.3 - 1.2 mg/dL Final  . GFR calc non Af Amer 07/22/2016 50* >60 mL/min Final  . GFR calc Af Amer 07/22/2016 58* >60 mL/min Final   Comment: (NOTE) The eGFR has been calculated using the CKD EPI equation. This calculation has not been validated in all clinical situations. eGFR's persistently <60  mL/min signify possible Chronic Kidney Disease.   . Anion gap 07/22/2016 5  5 - 15 Final  . Vit D, 25-Hydroxy 07/22/2016 37.1  30.0 - 100.0 ng/mL Final   Comment: (NOTE) Vitamin D deficiency has been defined by the [place] practice guideline as a level of serum 25-OH vitamin D less than 20 ng/mL (1,2). The Endocrine Society went on to further define vitamin D insufficiency as a level between 21 and 29 ng/mL (2). 1. IOM (Institute of Medicine). 2010. Dietary reference   intakes for calcium and D. [place]: The   [employer]  Press. 2. Holick MF, Binkley , Bischoff-Ferrari HA, et al.   Evaluation, treatment, and prevention of vitamin D   deficiency: an Endocrine Society clinical practice   guideline. JCEM. 2011 Jul; 96(7):1911-30. Performed At: [facility] [address] [place], [place] [id number] Lindon Romp MD RF:[phone]     Assessment:  Raveena Hebdon is a 66 y.o. female ***  Plan: 1. *** 2. *** 3. *** 4. *** 5. ***  Lequita Asal, MD  10/19/2016, 7:04 PM

## 2016-10-20 ENCOUNTER — Inpatient Hospital Stay: Payer: Medicare Other | Attending: Hematology and Oncology

## 2016-10-20 ENCOUNTER — Inpatient Hospital Stay: Payer: Medicare Other | Admitting: Hematology and Oncology

## 2016-10-22 ENCOUNTER — Ambulatory Visit: Payer: Medicare Other

## 2016-10-22 ENCOUNTER — Other Ambulatory Visit: Payer: Medicare Other

## 2024-03-09 LAB — GLUCOSE, POCT (MANUAL RESULT ENTRY): POC Glucose: 141 mg/dL — AB (ref 70–99)

## 2024-03-09 NOTE — Congregational Nurse Program (Signed)
  Dept: [phone]   Congregational Nurse Program Note  Date of Encounter: 03/09/2024  Past Medical History: Past Medical History:  Diagnosis Date   Anxiety    Breast cancer ([facility])    Cancer of left breast ([facility]) 01/26/2012   Hypertension    Renal insufficiency     Encounter Details:  Community Questionnaire - 03/09/24 1334       Questionnaire   Ask client: Do you give verbal consent for me to treat you today? Yes    Student Assistance N/A    Location Patient Served  [facility]    Encounter Setting CN site    Population Status Unknown    Insurance Medicare    Insurance/Financial Assistance Referral N/A    Medication N/A    Medical Provider Yes    Screening Referrals Made N/A    Medical Referrals Made N/A    Medical Appointment Completed N/A    CNP Interventions Advocate/Support    Screenings CN Performed Blood Glucose    ED Visit Averted N/A    Life-Saving Intervention Made N/A

## 2024-05-03 LAB — GLUCOSE, POCT (MANUAL RESULT ENTRY): POC Glucose: 145 mg/dL — AB (ref 70–99)

## 2024-05-03 NOTE — Congregational Nurse Program (Signed)
  Dept: [phone]   Congregational Nurse Program Note  Date of Encounter: 05/03/2024  Patient wants help with finding a diabetic educator/ nutritionist to help with managing DM and learning how to eat better.  I provided resource for [employer] nutrition [phone].   Past Medical History: Past Medical History:  Diagnosis Date   Anxiety    Breast cancer ([facility])    Cancer of left breast ([facility]) 01/26/2012   Hypertension    Renal insufficiency     Encounter Details:  Community Questionnaire - 05/03/24 1436       Questionnaire   Ask client: Do you give verbal consent for me to treat you today? Yes    Student Assistance N/A    [facility] Served  [facility]    Encounter Setting CN site    Population Status Unknown    Insurance Medicare;Medicaid    Insurance/Financial Assistance Referral N/A    Medication N/A    Medical Provider Yes    Screening Referrals Made N/A    Medical Referrals Made N/A    Medical Appointment Completed N/A    CNP Interventions Advocate/Support    Screenings CN Performed Blood Glucose    ED Visit Averted N/A    Life-Saving Intervention Made N/A          There were no vitals filed for this visit.
